# Patient Record
Sex: Female | Born: 1950
Health system: Southern US, Community
[De-identification: ages and names within clinical notes are randomized; demographics above are authoritative.]

## PROBLEM LIST (undated history)

## (undated) DIAGNOSIS — M1990 Unspecified osteoarthritis, unspecified site: Secondary | ICD-10-CM

## (undated) DIAGNOSIS — I10 Essential (primary) hypertension: Secondary | ICD-10-CM

---

## 1998-07-15 ENCOUNTER — Other Ambulatory Visit: Admission: RE | Admit: 1998-07-15 | Discharge: 1998-07-15 | Payer: Self-pay | Admitting: Obstetrics & Gynecology

## 2018-05-23 DIAGNOSIS — Z9889 Other specified postprocedural states: Secondary | ICD-10-CM

## 2018-05-23 HISTORY — DX: Other specified postprocedural states: Z98.890

## 2018-09-02 ENCOUNTER — Other Ambulatory Visit: Payer: Self-pay

## 2018-09-02 ENCOUNTER — Encounter (HOSPITAL_COMMUNITY): Payer: Self-pay | Admitting: Emergency Medicine

## 2018-09-02 ENCOUNTER — Emergency Department (HOSPITAL_COMMUNITY)
Admission: EM | Admit: 2018-09-02 | Discharge: 2018-09-02 | Disposition: A | Discharge status: Home / Self Care | Payer: Medicare Other | Attending: Emergency Medicine | Admitting: Emergency Medicine

## 2018-09-02 ENCOUNTER — Emergency Department (HOSPITAL_COMMUNITY): Payer: Medicare Other

## 2018-09-02 DIAGNOSIS — K625 Hemorrhage of anus and rectum: Secondary | ICD-10-CM | POA: Insufficient documentation

## 2018-09-02 DIAGNOSIS — R1084 Generalized abdominal pain: Secondary | ICD-10-CM | POA: Diagnosis not present

## 2018-09-02 LAB — POC OCCULT BLOOD, ED: Fecal Occult Bld: POSITIVE — AB

## 2018-09-02 LAB — CBC WITH DIFFERENTIAL/PLATELET
Abs Immature Granulocytes: 0.01 10*3/uL (ref 0.00–0.07)
Basophils Absolute: 0 10*3/uL (ref 0.0–0.1)
Basophils Relative: 0 %
Eosinophils Absolute: 0 10*3/uL (ref 0.0–0.5)
Eosinophils Relative: 0 %
HCT: 40.2 % (ref 36.0–46.0)
Hemoglobin: 12.6 g/dL (ref 12.0–15.0)
Immature Granulocytes: 0 %
Lymphocytes Relative: 16 %
Lymphs Abs: 1.2 10*3/uL (ref 0.7–4.0)
MCH: 28.8 pg (ref 26.0–34.0)
MCHC: 31.3 g/dL (ref 30.0–36.0)
MCV: 92 fL (ref 80.0–100.0)
Monocytes Absolute: 0.4 10*3/uL (ref 0.1–1.0)
Monocytes Relative: 5 %
Neutro Abs: 5.7 10*3/uL (ref 1.7–7.7)
Neutrophils Relative %: 79 %
Platelets: 236 10*3/uL (ref 150–400)
RBC: 4.37 MIL/uL (ref 3.87–5.11)
RDW: 13.9 % (ref 11.5–15.5)
WBC: 7.3 10*3/uL (ref 4.0–10.5)
nRBC: 0 % (ref 0.0–0.2)

## 2018-09-02 LAB — COMPREHENSIVE METABOLIC PANEL
ALT: 14 U/L (ref 0–44)
AST: 15 U/L (ref 15–41)
Albumin: 4.2 g/dL (ref 3.5–5.0)
Alkaline Phosphatase: 67 U/L (ref 38–126)
Anion gap: 11 (ref 5–15)
BUN: 21 mg/dL (ref 8–23)
CO2: 23 mmol/L (ref 22–32)
Calcium: 8.8 mg/dL — ABNORMAL LOW (ref 8.9–10.3)
Chloride: 104 mmol/L (ref 98–111)
Creatinine, Ser: 1.07 mg/dL — ABNORMAL HIGH (ref 0.44–1.00)
GFR calc Af Amer: >60 mL/min (ref >60)
GFR calc non Af Amer: 54 mL/min — ABNORMAL LOW (ref >60)
Glucose, Bld: 133 mg/dL — ABNORMAL HIGH (ref 70–99)
Potassium: 3.5 mmol/L (ref 3.5–5.1)
Sodium: 138 mmol/L (ref 135–145)
Total Bilirubin: 0.6 mg/dL (ref 0.3–1.2)
Total Protein: 7.8 g/dL (ref 6.5–8.1)

## 2018-09-02 LAB — ABO/RH: ABO/RH(D): A POS

## 2018-09-02 LAB — TYPE AND SCREEN
ABO/RH(D): A POS
Antibody Screen: NEGATIVE

## 2018-09-02 LAB — LIPASE, BLOOD: Lipase: 40 U/L (ref 11–51)

## 2018-09-02 LAB — HEMOGLOBIN AND HEMATOCRIT, BLOOD
HCT: 36.5 % (ref 36.0–46.0)
Hemoglobin: 11.9 g/dL — ABNORMAL LOW (ref 12.0–15.0)

## 2018-09-02 MED ORDER — SODIUM CHLORIDE (PF) 0.9 % IJ SOLN
INTRAMUSCULAR | Status: AC
  Filled 2018-09-02: qty 50

## 2018-09-02 MED ORDER — IOHEXOL 300 MG/ML  SOLN
100.0000 mL | Freq: Once | INTRAMUSCULAR | Status: AC | PRN
  Administered 2018-09-02: 100 mL via INTRAVENOUS

## 2018-09-02 MED ORDER — SODIUM CHLORIDE 0.9 % IV BOLUS
500.0000 mL | Freq: Once | INTRAVENOUS | Status: AC
  Administered 2018-09-02: 500 mL via INTRAVENOUS

## 2018-09-02 NOTE — ED Provider Notes (Signed)
[facility]-EMERGENCY DEPT Provider Note   CSN: [id number] Arrival date & time: 09/02/18  1349    History   Chief Complaint Chief Complaint  Patient presents with   GI Bleeding    HPI [place] is a 68 y.o. female presenting for evaluation of bright red blood per rectum and abdominal discomfort.  Patient states acutely at 1030 this morning, she had a bowel movement that she noticed was bloody.  She describes it as a mix of bright red and dark stool.  She has had 4-5 bowel movements since, all have been bloody.  She reports generalized abdominal discomfort, but states this may be due to anxiety.  She also reports some mild nausea, but once again attributes this to anxiety.  Patient states she does not see doctors, because she gets very anxious.  She has never had a colonoscopy.  She reports no medical problems, takes no medications daily.  She denies history of blood in the stool previously.  She denies significant hemorrhoid history.  She does not take NSAIDs frequently.  She denies significant heartburn.  She denies recent fevers, chills, chest pain, shortness of breath, vomiting, urinary symptoms.  She denies history of abdominal surgeries or abdominal problems.  She has had 2 cups of coffee today, but no p.o.  She had no symptoms yesterday.     HPI  History reviewed. No pertinent past medical history.  There are no active problems to display for this patient.   History reviewed. No pertinent surgical history.   OB History   No obstetric history on file.      Home Medications    Prior to Admission medications   Not on File    Family History No family history on file.  Social History Social History   Tobacco Use   Smoking status: Never Smoker   Smokeless tobacco: Never Used  Substance Use Topics   Alcohol use: Yes    Alcohol/week: 5.0 standard drinks    Types: 5 Glasses of wine per week   Drug use: Not on file     Allergies    Other   Review of Systems Review of Systems  Gastrointestinal: Positive for abdominal pain, blood in stool and diarrhea.  Psychiatric/Behavioral: The patient is nervous/anxious.   All other systems reviewed and are negative.    Physical Exam Updated Vital Signs BP (!) 171/96    Pulse 93    Temp 98.4 F (36.9 C) (Oral)    Resp (!) 33    Ht  (1.753 m)    SpO2 98%   Physical Exam Vitals signs and nursing note reviewed. Exam conducted with a chaperone present.  Constitutional:      General: She is not in acute distress.    Appearance: She is well-developed.     Comments: Obese female who appears anxious, but no acute distress  HENT:     Head: Normocephalic and atraumatic.  Eyes:     Extraocular Movements: Extraocular movements intact.     Conjunctiva/sclera: Conjunctivae normal.     Pupils: Pupils are equal, round, and reactive to light.  Neck:     Musculoskeletal: Normal range of motion and neck supple.  Cardiovascular:     Rate and Rhythm: Regular rhythm. Tachycardia present.     Pulses: Normal pulses.     Comments: Tachycardic around 110 Pulmonary:     Effort: Pulmonary effort is normal. No respiratory distress.     Breath sounds: Normal breath sounds. No  wheezing.     Comments: Clear lung sounds in all fields. Abdominal:     General: There is no distension.     Palpations: Abdomen is soft. There is no mass.     Tenderness: There is no abdominal tenderness. There is no guarding or rebound.     Comments: Patient reports generalized discomfort, but no focal tenderness to palpation.  No rigidity, guarding, distention.  Negative rebound.  Genitourinary:    Rectum: Guaiac result positive.     Comments: Bright red blood noted on rectal exam. Musculoskeletal: Normal range of motion.  Skin:    General: Skin is warm and dry.     Capillary Refill: Capillary refill takes less than 2 seconds.  Neurological:     Mental Status: She is alert and oriented to person, place,  and time.  Psychiatric:        Mood and Affect: Mood is anxious.     Comments: Patient is very anxious.  Initially, patient was shaking, however calm down while answering questions and throughout h&p      ED Treatments / Results  Labs (all labs ordered are listed, but only abnormal results are displayed) Labs Reviewed  COMPREHENSIVE METABOLIC PANEL - Abnormal; Notable for the following components:      Result Value   Glucose, Bld 133 (*)    Creatinine, Ser 1.07 (*)    Calcium 8.8 (*)    GFR calc non Af Amer 54 (*)    All other components within normal limits  HEMOGLOBIN AND HEMATOCRIT, BLOOD - Abnormal; Notable for the following components:   Hemoglobin 11.9 (*)    All other components within normal limits  POC OCCULT BLOOD, ED - Abnormal; Notable for the following components:   Fecal Occult Bld POSITIVE (*)    All other components within normal limits  CBC WITH DIFFERENTIAL/PLATELET  LIPASE, BLOOD  TYPE AND SCREEN  ABO/RH    EKG None  Radiology Ct Abdomen Pelvis W Contrast  Result Date: 09/02/2018 CLINICAL DATA:  68 year old with acute onset crampy abdominal pain and 4-5 episodes of bloody loose stools that began this morning. EXAM: CT ABDOMEN AND PELVIS WITH CONTRAST TECHNIQUE: Multidetector CT imaging of the abdomen and pelvis was performed using the standard protocol following bolus administration of intravenous contrast. CONTRAST:  OMNIPAQUE IOHEXOL 300 MG/ML IV. COMPARISON:  None. FINDINGS: Lower chest: Heart size upper normal. Visualized lung bases clear. Hepatobiliary: Liver normal in size and appearance. Gallbladder normal in appearance without calcified gallstones. No biliary ductal dilation. Pancreas: Normal in appearance without evidence of mass, ductal dilation, or inflammation. Spleen: Normal in size and appearance. Adrenals/Urinary Tract: Normal appearing adrenal glands. Kidneys normal in size and appearance without focal parenchymal abnormality. No  hydronephrosis. No evidence of urinary tract calculi. Urinary bladder decompressed and unremarkable. Stomach/Bowel: Small to moderate-sized hiatal hernia. Stomach otherwise normal in appearance. Normal-appearing small bowel. Diffuse colonic diverticulosis without evidence of acute diverticulitis. Mobile cecum positioned in the RIGHT UPPER QUADRANT of the abdomen. Normal appearing gas-filled appendix in the RIGHT UPPER QUADRANT with its tip adjacent to the inferior liver edge. Vascular/Lymphatic: Mild abdominal aortic atherosclerosis without evidence of aneurysm. Normal-appearing portal venous and systemic venous systems. No pathologic lymphadenopathy. Reproductive: Normal-appearing atrophic uterus. No adnexal masses. Other: Pelvic phleboliths. Small umbilical hernia containing fat. Musculoskeletal: Osseous demineralization. Facet degenerative changes throughout the lumbar spine. No acute findings. IMPRESSION: 1. No acute abnormalities involving the abdomen or pelvis. 2. Diffuse colonic diverticulosis without evidence of acute diverticulitis. 3. Small to  moderate-sized hiatal hernia. 4.  Aortic Atherosclerosis, mild.  (ICD10-170.0) Electronically Signed   By: Hulan Saashomas  Lawrence M.D.   On: 09/02/2018 16:28    Procedures Procedures (including critical care time)  Medications Ordered in ED Medications  sodium chloride (PF) 0.9 % injection (0 mLs  Hold 09/02/18 1625)  sodium chloride 0.9 % bolus 500 mL (0 mLs Intravenous Stopped 09/02/18 1653)  iohexol (OMNIPAQUE) 300 MG/ML solution 100 mL (100 mLs Intravenous Contrast Given 09/02/18 1557)     Initial Impression / Assessment and Plan / ED Course  I have reviewed the triage vital signs and the nursing notes.  Pertinent labs & imaging results that were available during my care of the patient were reviewed by me and considered in my medical decision making (see chart for details).        Patient presenting for evaluation of bright red blood per rectum.   Physical exam shows patient who is very anxious.  It is hard to distinguish if the tachycardia is due to her anxiety or blood loss.  Patient also reporting nausea and abdominal discomfort, which she is attributing to anxiety.  However, consider possible diverticulitis versus other intra-abdominal infection versus abnormality.  As such, will obtain labs, CT, and reassess.  Labs reassuring, initial hemoglobin stable at 12.6.  No leukocytosis.  Mild dehydration with a slightly elevated creatinine 1.07, although no baseline to compare.  CT pending.  CT negative for reticulitis or obvious cause for bleed.  Patient does have diverticulosis, consider possible bleed from there. Repeat hgb stable at 11.9.  As patient is not on blood thinners, has a stable hemoglobin, and appears well, I do not believe she needs to come into the ER for an emergent colonoscopy or EGD. However, I am concerned about her ability to follow-up.  As such, will consult with GI.  Discussed with Dr. Marca AnconaKarki from [facility] who states somebody from their office will call the patient to set up a follow-up appointment.  Discussed findings and plan with patient.  Discussed strict return precautions including fevers, pain, worsening bleeding, or signs of anemia.  At this time, patient appears safe for discharge.  Patient states she understands agrees plan.   Final Clinical Impressions(s) / ED Diagnoses   Final diagnoses:  BRBPR (bright red blood per rectum)    ED Discharge Orders    None       Alveria ApleyCaccavale, Savio Albrecht, PA-C 09/02/18 1911    Lorre NickAllen, Anthony, MD 09/03/18 1102

## 2018-09-02 NOTE — ED Provider Notes (Signed)
Medical screening examination/treatment/procedure(s) were conducted as a shared visit with non-physician practitioner(s) and myself.  I personally evaluated the patient during the encounter.  None 68 year old female presents with acute onset of rectal bleeding that began today.  Notes abdominal bloating but no distention.  No prior history of same.  Suspect diverticular source and labs and abdominal CT are pending at this time   Lorre Nick, MD 09/02/18 1450

## 2018-09-02 NOTE — ED Triage Notes (Signed)
Patient from home c/o mild cramping and multiple (4-5) urgent episodes of loose bloody stools. Pt denies nausea or vomiting. Pt very anxious, states she has fear and anxiety of doctors and hospitals.

## 2018-09-02 NOTE — Discharge Instructions (Addendum)
Somebody from the stomach doctor/GI doctors office should be calling you to set up an appointment.  If you do not hear from them, you may call their office listed below to set up follow-up. You should start with a clear liquid diet, progressing to soft foods and then a bland diet as symptoms improve. Avoid things that may irritate your GI tract such as ibuprofen, Aleve, naproxen, spicy food, acidic food, caffeine. Return to the emergency room if you develop significant worsening bleeding, fevers, swelling and pain of your stomach, persistent vomiting, or any new, worsening, or concerning symptoms.

## 2018-10-19 LAB — HM COLONOSCOPY

## 2019-12-16 ENCOUNTER — Encounter (HOSPITAL_COMMUNITY): Payer: Self-pay

## 2019-12-16 ENCOUNTER — Other Ambulatory Visit: Payer: Self-pay

## 2019-12-16 DIAGNOSIS — L03116 Cellulitis of left lower limb: Secondary | ICD-10-CM | POA: Insufficient documentation

## 2019-12-16 DIAGNOSIS — R000 Tachycardia, unspecified: Secondary | ICD-10-CM | POA: Diagnosis not present

## 2019-12-16 LAB — COMPREHENSIVE METABOLIC PANEL
ALT: 32 U/L (ref 0–44)
AST: 33 U/L (ref 15–41)
Albumin: 3.9 g/dL (ref 3.5–5.0)
Alkaline Phosphatase: 106 U/L (ref 38–126)
Anion gap: 11 (ref 5–15)
BUN: 14 mg/dL (ref 8–23)
CO2: 25 mmol/L (ref 22–32)
Calcium: 9 mg/dL (ref 8.9–10.3)
Chloride: 100 mmol/L (ref 98–111)
Creatinine, Ser: 0.95 mg/dL (ref 0.44–1.00)
GFR calc Af Amer: >60 mL/min (ref >60)
GFR calc non Af Amer: >60 mL/min (ref >60)
Glucose, Bld: 116 mg/dL — ABNORMAL HIGH (ref 70–99)
Potassium: 3.3 mmol/L — ABNORMAL LOW (ref 3.5–5.1)
Sodium: 136 mmol/L (ref 135–145)
Total Bilirubin: 1 mg/dL (ref 0.3–1.2)
Total Protein: 8.2 g/dL — ABNORMAL HIGH (ref 6.5–8.1)

## 2019-12-16 LAB — CBC WITH DIFFERENTIAL/PLATELET
Abs Immature Granulocytes: 0.03 10*3/uL (ref 0.00–0.07)
Basophils Absolute: 0 10*3/uL (ref 0.0–0.1)
Basophils Relative: 0 %
Eosinophils Absolute: 0 10*3/uL (ref 0.0–0.5)
Eosinophils Relative: 0 %
HCT: 43.4 % (ref 36.0–46.0)
Hemoglobin: 14 g/dL (ref 12.0–15.0)
Immature Granulocytes: 0 %
Lymphocytes Relative: 13 %
Lymphs Abs: 0.9 10*3/uL (ref 0.7–4.0)
MCH: 29.2 pg (ref 26.0–34.0)
MCHC: 32.3 g/dL (ref 30.0–36.0)
MCV: 90.6 fL (ref 80.0–100.0)
Monocytes Absolute: 0.6 10*3/uL (ref 0.1–1.0)
Monocytes Relative: 9 %
Neutro Abs: 5.3 10*3/uL (ref 1.7–7.7)
Neutrophils Relative %: 78 %
Platelets: 194 10*3/uL (ref 150–400)
RBC: 4.79 MIL/uL (ref 3.87–5.11)
RDW: 14.5 % (ref 11.5–15.5)
WBC: 6.8 10*3/uL (ref 4.0–10.5)
nRBC: 0 % (ref 0.0–0.2)

## 2019-12-16 NOTE — ED Triage Notes (Signed)
Pt reports bilateral LE swelling, and redness getting worse since yesterday.

## 2019-12-17 ENCOUNTER — Emergency Department (HOSPITAL_COMMUNITY)
Admission: EM | Admit: 2019-12-17 | Discharge: 2019-12-17 | Disposition: A | Discharge status: Home / Self Care | Payer: Medicare Other | Attending: Emergency Medicine | Admitting: Emergency Medicine

## 2019-12-17 DIAGNOSIS — L03116 Cellulitis of left lower limb: Secondary | ICD-10-CM | POA: Diagnosis not present

## 2019-12-17 MED ORDER — SODIUM CHLORIDE 0.9 % IV SOLN
3.0000 g | Freq: Once | INTRAVENOUS | Status: AC
  Administered 2019-12-17: 3 g via INTRAVENOUS
  Filled 2019-12-17: qty 8

## 2019-12-17 MED ORDER — SULFAMETHOXAZOLE-TRIMETHOPRIM 800-160 MG PO TABS: 1.0000 | ORAL_TABLET | Freq: Two times a day (BID) | ORAL | 0 refills | Status: DC

## 2019-12-17 MED ORDER — VANCOMYCIN HCL IN DEXTROSE 1-5 GM/200ML-% IV SOLN
1000.0000 mg | Freq: Once | INTRAVENOUS | Status: AC
  Administered 2019-12-17: 1000 mg via INTRAVENOUS
  Filled 2019-12-17: qty 200

## 2019-12-17 MED ORDER — AMOXICILLIN-POT CLAVULANATE 875-125 MG PO TABS: 1.0000 | ORAL_TABLET | Freq: Two times a day (BID) | ORAL | 0 refills | Status: DC

## 2019-12-17 NOTE — ED Provider Notes (Signed)
[facility]-EMERGENCY DEPT Provider Note   CSN: [id number] Arrival date & time: 12/16/19  2011   Time seen 5:25 AM  History Chief Complaint  Patient presents with  . Cellulitis    Tammy Fitzpatrick is a 69 y.o. female.  HPI   Patient states last week her dog jumped up on the bed and its toenail made a puncture wound on the anterior part of her left lower leg.  She states it bled briefly and was fine.  She states July 23 she had shakes and chills all day without fever.  She slept all day on July 24.  On July 25 she had a small red area close to where the puncture wound was and it was about 10 cm in size.  She states yesterday, July 26 while she was at work she noted her left leg was feeling tight and she started having increasing redness.  She states when she came to the ED the redness extended between her ankle and below her knee however while she has been waiting to be seen which was 10 hours the redness has spread to the anterior portion of her knee.  Patient states she took [employer] vaccine.  PCP None  History reviewed. No pertinent past medical history.  There are no problems to display for this patient.   History reviewed. No pertinent surgical history.   OB History   No obstetric history on file.     No family history on file.  Social History   Tobacco Use  . Smoking status: Never Smoker  . Smokeless tobacco: Never Used  Vaping Use  . Vaping Use: Never used  Substance Use Topics  . Alcohol use: Yes    Alcohol/week: 5.0 standard drinks    Types: 5 Glasses of wine per week  . Drug use: Not on file    Home Medications Prior to Admission medications   Medication Sig Start Date End Date Taking? Authorizing Provider  amoxicillin-clavulanate (AUGMENTIN) 875-125 MG tablet Take 1 tablet by mouth every 12 (twelve) hours. 12/17/19   Devoria Albe, MD  sulfamethoxazole-trimethoprim (BACTRIM DS) 800-160 MG tablet Take 1 tablet by mouth 2 (two) times  daily. 12/17/19   Devoria Albe, MD    Allergies    Other  Review of Systems   Review of Systems  All other systems reviewed and are negative.   Physical Exam Updated Vital Signs BP (!) 198/98   Pulse 94   Temp 99.4 F (37.4 C) (Oral)   Resp 16   Ht 5\' 9"  (1.753 m)   Wt (!) 113.4 kg   SpO2 96%   BMI 36.92 kg/m   Physical Exam Vitals and nursing note reviewed.  Constitutional:      General: She is not in acute distress.    Appearance: Normal appearance. She is obese.  HENT:     Head: Normocephalic and atraumatic.     Right Ear: External ear normal.     Left Ear: External ear normal.  Eyes:     Extraocular Movements: Extraocular movements intact.     Conjunctiva/sclera: Conjunctivae normal.  Cardiovascular:     Rate and Rhythm: Tachycardia present.  Pulmonary:     Effort: Pulmonary effort is normal. No respiratory distress.  Musculoskeletal:        General: Swelling present.  Skin:    General: Skin is warm and dry.     Findings: Erythema present.     Comments: Patient is noted to have diffuse mild  swelling of her left lower leg with intense bright redness of the skin between her ankle and to the anterior portion of her knee.  It is 6 also almost circumferential around the ankle area.  There also is some mild redness and swelling involving the foot.  She has good distal pulses and capillary refill.  Neurological:     General: No focal deficit present.     Mental Status: She is alert and oriented to person, place, and time.     Cranial Nerves: No cranial nerve deficit.  Psychiatric:        Mood and Affect: Mood normal.        Behavior: Behavior normal.        Thought Content: Thought content normal.       ED Results / Procedures / Treatments   Labs (all labs ordered are listed, but only abnormal results are displayed) Results for orders placed or performed during the hospital encounter of 12/17/19  CBC with Differential  Result Value Ref Range   WBC 6.8 4.0 -  10.5 K/uL   RBC 4.79 3.87 - 5.11 MIL/uL   Hemoglobin 14.0 12.0 - 15.0 g/dL   HCT 41.6 36 - 46 %   MCV 90.6 80.0 - 100.0 fL   MCH 29.2 26.0 - 34.0 pg   MCHC 32.3 30.0 - 36.0 g/dL   RDW 60.6 30.1 - 60.1 %   Platelets 194 150 - 400 K/uL   nRBC 0.0 0.0 - 0.2 %   Neutrophils Relative % 78 %   Neutro Abs 5.3 1.7 - 7.7 K/uL   Lymphocytes Relative 13 %   Lymphs Abs 0.9 0.7 - 4.0 K/uL   Monocytes Relative 9 %   Monocytes Absolute 0.6 0 - 1 K/uL   Eosinophils Relative 0 %   Eosinophils Absolute 0.0 0 - 0 K/uL   Basophils Relative 0 %   Basophils Absolute 0.0 0 - 0 K/uL   Immature Granulocytes 0 %   Abs Immature Granulocytes 0.03 0.00 - 0.07 K/uL  Comprehensive metabolic panel  Result Value Ref Range   Sodium 136 135 - 145 mmol/L   Potassium 3.3 (L) 3.5 - 5.1 mmol/L   Chloride 100 98 - 111 mmol/L   CO2 25 22 - 32 mmol/L   Glucose, Bld 116 (H) 70 - 99 mg/dL   BUN 14 8 - 23 mg/dL   Creatinine, Ser 0.93 0.44 - 1.00 mg/dL   Calcium 9.0 8.9 - 23.5 mg/dL   Total Protein 8.2 (H) 6.5 - 8.1 g/dL   Albumin 3.9 3.5 - 5.0 g/dL   AST 33 15 - 41 U/L   ALT 32 0 - 44 U/L   Alkaline Phosphatase 106 38 - 126 U/L   Total Bilirubin 1.0 0.3 - 1.2 mg/dL   GFR calc non Af Amer >60 >60 mL/min   GFR calc Af Amer >60 >60 mL/min   Anion gap 11 5 - 15   Laboratory interpretation all normal except mild hypokalemia, nonfasting hyperglycemia    EKG None  Radiology No results found.  Procedures Procedures (including critical care time)  Medications Ordered in ED Medications  vancomycin (VANCOCIN) IVPB 1000 mg/200 mL premix (1,000 mg Intravenous New Bag/Given 12/17/19 0656)  Ampicillin-Sulbactam (UNASYN) 3 g in sodium chloride 0.9 % 100 mL IVPB (0 g Intravenous Stopped 12/17/19 5732)    ED Course  I have reviewed the triage vital signs and the nursing notes.  Pertinent labs & imaging results that were available during  my care of the patient were reviewed by me and considered in my medical decision  making (see chart for details).    MDM Rules/Calculators/A&P                          Patient was given a dose of IV vancomycin and in consideration that this started out as a puncture wound from a dog toenail which could have dog saliva on it she also was given Unasyn.  Patient does not want to be admitted to the hospital.  She wants to try outpatient therapy.  She does want however want to do the first dose of antibiotics IV.  When she is discharged home I will discharge her home on Augmentin and Septra for same reasoning as the IV antibiotics.  7:00 AM patient is gotten her Unasyn IV and is just starting her vancomycin.  She will be discharged home per her request to treat her cellulitis.  Final Clinical Impression(s) / ED Diagnoses Final diagnoses:  Left leg cellulitis    Rx / DC Orders ED Discharge Orders         Ordered    amoxicillin-clavulanate (AUGMENTIN) 875-125 MG tablet  Every 12 hours     Discontinue  Reprint     12/17/19 0643    sulfamethoxazole-trimethoprim (BACTRIM DS) 800-160 MG tablet  2 times daily     Discontinue  Reprint     12/17/19 0643          Plan discharge  Devoria Albe, MD, Concha Pyo, MD 12/17/19 [phone]

## 2019-12-17 NOTE — Discharge Instructions (Signed)
Elevate your leg.  Take the 2 antibiotics until gone.  Return to the emergency room if you get fever, vomiting, worsening pain or if the redness continues to spread up your leg.

## 2020-11-16 ENCOUNTER — Emergency Department (HOSPITAL_COMMUNITY)
Admission: EM | Admit: 2020-11-16 | Discharge: 2020-11-16 | Disposition: A | Discharge status: Home / Self Care | Payer: Medicare Other | Attending: Emergency Medicine | Admitting: Emergency Medicine

## 2020-11-16 ENCOUNTER — Encounter (HOSPITAL_COMMUNITY): Payer: Self-pay

## 2020-11-16 ENCOUNTER — Other Ambulatory Visit: Payer: Self-pay

## 2020-11-16 DIAGNOSIS — S8991XA Unspecified injury of right lower leg, initial encounter: Secondary | ICD-10-CM | POA: Diagnosis present

## 2020-11-16 DIAGNOSIS — I10 Essential (primary) hypertension: Secondary | ICD-10-CM | POA: Insufficient documentation

## 2020-11-16 DIAGNOSIS — Z23 Encounter for immunization: Secondary | ICD-10-CM | POA: Diagnosis not present

## 2020-11-16 DIAGNOSIS — S81811A Laceration without foreign body, right lower leg, initial encounter: Secondary | ICD-10-CM | POA: Insufficient documentation

## 2020-11-16 DIAGNOSIS — W548XXA Other contact with dog, initial encounter: Secondary | ICD-10-CM | POA: Insufficient documentation

## 2020-11-16 HISTORY — DX: Unspecified osteoarthritis, unspecified site: M19.90

## 2020-11-16 HISTORY — DX: Essential (primary) hypertension: I10

## 2020-11-16 MED ORDER — LIDOCAINE-EPINEPHRINE 2 %-1:100000 IJ SOLN: 20.0000 mL | Freq: Once | INTRAMUSCULAR | Status: DC

## 2020-11-16 MED ORDER — CEPHALEXIN 500 MG PO CAPS
500.0000 mg | ORAL_CAPSULE | Freq: Once | ORAL | Status: AC
  Administered 2020-11-16: 500 mg via ORAL
  Filled 2020-11-16: qty 1

## 2020-11-16 MED ORDER — LIDOCAINE-EPINEPHRINE (PF) 2 %-1:200000 IJ SOLN
20.0000 mL | Freq: Once | INTRAMUSCULAR | Status: AC
  Administered 2020-11-16: 20 mL via INTRADERMAL

## 2020-11-16 MED ORDER — CEPHALEXIN 500 MG PO CAPS: 500.0000 mg | ORAL_CAPSULE | Freq: Two times a day (BID) | ORAL | 0 refills | Status: AC

## 2020-11-16 MED ORDER — TETANUS-DIPHTH-ACELL PERTUSSIS 5-2.5-18.5 LF-MCG/0.5 IM SUSY
0.5000 mL | PREFILLED_SYRINGE | Freq: Once | INTRAMUSCULAR | Status: AC
  Administered 2020-11-16: 0.5 mL via INTRAMUSCULAR
  Filled 2020-11-16: qty 0.5

## 2020-11-16 NOTE — ED Notes (Signed)
Provided pt w 4x4 gauze and kerlex to bring home w her, went over dc paperwork, follow up care, wound care- advised pt that sutures need to be removed in 10 days- can go to wound care, UC, PCP or ED. Pt understood and has no questions

## 2020-11-16 NOTE — Discharge Instructions (Addendum)
Your stitches need to be removed in 10 days.  You should schedule a follow-up appointment of possible in the office with the wound care specialist at the number above.  If you are not able to make this appointment, you can return to an urgent care, primary care doctor's office, or emergency department for the stitches to be removed.  Please keep the bandages covering your wound for at least 2 days.  Keep it dry for that period.  Afterwards, you can take down the bandages to begin to shower normally.  If you choose to, you can then wrap the wound with a bandage to prevent any bleeding onto your clothes. Try not to soak the wound under water.  Do not put peroxide or bacitracin or other ointment on the wound.  I prescribed you antibiotics to prevent an infection.    Your wound is under a lot of pressure.  Try to keep off your feet as much as possible for the next several days, avoid strenuous activity.  When you are at home put your leg up on the couch.  If you begin having sudden bleeding, take a clean piece of gauze and hold very firm pressure directly over the bleed with a single finger.  Do not let go for 10 minutes.  If you have persistent bleeding after that, or if you are feeling lightheaded, or concerned about blood loss, call 911 or come back to the ER.  If you notice redness creeping up or down your leg, or begin having fevers, or you see pus coming from the wound, please return to the emergency department.  These are signs of an infection that are not being adequately treated with your antibiotic at home.  *  We gave you a tetanus booster while you were here in the ER.

## 2020-11-16 NOTE — ED Triage Notes (Signed)
Pt c/o rt lower leg lac, states dogs jumped on her while she was sitting in bed, and caused a large laceration. Pt denies anticoags, states dogs are up to date on vaccines. C/o 3/10 pain

## 2020-11-16 NOTE — ED Provider Notes (Signed)
[facility]-EMERGENCY DEPT Provider Note   CSN: [id number] Arrival date & time: 11/16/20  2952     History CC:  Laceration to right leg   Tammy Fitzpatrick is a 70 y.o. female presenting to ED with right leg laceration. She reports her dogs jumped on the bed today and accidentally scratched her right leg, which began bleeding.  She arrives by POV.  Unsure of last tetanus shot.  No dog bite today.  Not on A/C  HPI     Past Medical History:  Diagnosis Date   Arthritis    Hypertension     There are no problems to display for this patient.   History reviewed. No pertinent surgical history.   OB History   No obstetric history on file.     No family history on file.  Social History   Tobacco Use   Smoking status: Never   Smokeless tobacco: Never  Vaping Use   Vaping Use: Never used  Substance Use Topics   Alcohol use: Yes    Alcohol/week: 2.0 standard drinks    Types: 2 Glasses of wine per week   Drug use: Never    Home Medications Prior to Admission medications   Medication Sig Start Date End Date Taking? Authorizing Provider  cephALEXin (KEFLEX) 500 MG capsule Take 1 capsule (500 mg total) by mouth 2 (two) times daily for 5 days. 11/16/20 11/21/20 Yes Kaiyana Bedore, Kermit Balo, MD  Multiple Vitamin (MULTIVITAMIN WITH MINERALS) TABS tablet Take 1 tablet by mouth daily.   Yes [provider]  Pseudoephedrine-Ibuprofen (ADVIL COLD/SINUS PO) Take 1 tablet by mouth daily as needed (headache).   Yes [provider]  amoxicillin-clavulanate (AUGMENTIN) 875-125 MG tablet Take 1 tablet by mouth every 12 (twelve) hours. Patient not taking: Reported on 11/16/2020 12/17/19   Devoria Albe, MD  sulfamethoxazole-trimethoprim (BACTRIM DS) 800-160 MG tablet Take 1 tablet by mouth 2 (two) times daily. Patient not taking: Reported on 11/16/2020 12/17/19   Devoria Albe, MD    Allergies    Other  Review of Systems   Review of Systems  Constitutional:   Negative for chills and fever.  Gastrointestinal:  Negative for abdominal pain and vomiting.  Musculoskeletal:  Positive for myalgias. Negative for arthralgias and back pain.  Skin:  Positive for rash and wound.  Neurological:  Negative for weakness and numbness.   Physical Exam Updated Vital Signs BP (!) 181/90 (BP Location: Right Arm)   Pulse 84   Temp 97.6 F (36.4 C) (Oral)   Resp 18   Ht 5\' 9"  (1.753 m)   Wt 124.7 kg   SpO2 98%   BMI 40.61 kg/m   Physical Exam Constitutional:      General: She is not in acute distress. HENT:     Head: Normocephalic and atraumatic.  Eyes:     Conjunctiva/sclera: Conjunctivae normal.     Pupils: Pupils are equal, round, and reactive to light.  Cardiovascular:     Rate and Rhythm: Normal rate and regular rhythm.  Pulmonary:     Effort: Pulmonary effort is normal. No respiratory distress.  Musculoskeletal:     Comments: Chronic lymphedema of bilateral lower extremities Large dog ear laceration to right leg, involving skin only, subcutaneous fat exposed, small bleeding venule on medial corner aspect of wound. Laceration measuring approx 7 cm in total  Skin:    General: Skin is warm and dry.  Neurological:     Mental Status: She is alert.  ED Results / Procedures / Treatments   Labs (all labs ordered are listed, but only abnormal results are displayed) Labs Reviewed - No data to display  EKG None  Radiology No results found.  Procedures .Marland KitchenLaceration Repair  Date/Time: 11/16/2020 10:12 AM Performed by: Terald Sleeper, MD Authorized by: Terald Sleeper, MD   Consent:    Consent obtained:  Verbal   Consent given by:  Patient   Risks, benefits, and alternatives were discussed: yes     Risks discussed:  Pain, infection, poor cosmetic result, poor wound healing and need for additional repair Universal protocol:    Procedure explained and questions answered to patient or proxy's satisfaction: yes     Relevant documents  present and verified: yes     Test results available: yes     Imaging studies available: yes     Required blood products, implants, devices, and special equipment available: yes     Site/side marked: yes     Patient identity confirmed:  Arm band Anesthesia:    Anesthesia method:  Local infiltration   Local anesthetic:  Lidocaine 1% WITH epi Laceration details:    Location:  Leg   Leg location:  R lower leg   Length (cm):  7   Depth (mm):  2 Pre-procedure details:    Preparation:  Patient was prepped and draped in usual sterile fashion Exploration:    Hemostasis achieved with:  Epinephrine and direct pressure   Imaging outcome: foreign body not noted     Wound exploration: wound explored through full range of motion     Contaminated: no   Treatment:    Area cleansed with:  Saline   Amount of cleaning:  Standard   Irrigation solution:  Sterile saline   Debridement:  None Skin repair:    Repair method:  Staples and sutures   Suture size:  3-0   Suture material:  Prolene   Suture technique:  Figure eight and simple interrupted   Number of sutures:  8 Approximation:    Approximation:  Loose Repair type:    Repair type:  Complex Post-procedure details:    Dressing:  Non-adherent dressing   Procedure completion:  Tolerated well, no immediate complications Comments:     2 figure of 8 sutures placed to stem minimally bleeding venule on both ends of the wound Simple inturrupted sutures, both 3-0 and 4-0 prolene used to approximate the rest of the wound edges    Medications Ordered in ED Medications  lidocaine-EPINEPHrine (XYLOCAINE W/EPI) 2 %-1:200000 (PF) injection 20 mL (20 mLs Intradermal Given 11/16/20 0945)  cephALEXin (KEFLEX) capsule 500 mg (500 mg Oral Given 11/16/20 1016)  Tdap (BOOSTRIX) injection 0.5 mL (0.5 mLs Intramuscular Given 11/16/20 1032)    ED Course  I have reviewed the triage vital signs and the nursing notes.  Pertinent labs & imaging results that were  available during my care of the patient were reviewed by me and considered in my medical decision making (see chart for details).  70 yo female here with leg laceration Will update tdap Start on keflex given dirty nature of wound (dog nails)  Wound irrigated copiously prior to repair.  Complex wound repair requiring figure of 8 sutures to stem bleeding venules.  Bleeding stopped with pressure and sutures Doubt arterial bleed  Wound edges approximated on repair with 8 stitches I'm concerned about the healing of this wound given her lymphedema and likely poor vascular supply to this region.  The wound is  also under significant tension.  I advised she f/u in the plastic/wound care clinic for suture removal and wound reassessment in 10 days.    Final Clinical Impression(s) / ED Diagnoses Final diagnoses:  Laceration of right lower extremity, initial encounter    Rx / DC Orders ED Discharge Orders          Ordered    cephALEXin (KEFLEX) 500 MG capsule  2 times daily        11/16/20 1019             Meyah Corle, Kermit Balo, MD 11/16/20 1844

## 2020-11-16 NOTE — ED Notes (Signed)
Lido w epi administered by provider, suture trey brought to bedside, provider irrigated wound w sterile water

## 2020-11-26 ENCOUNTER — Encounter (HOSPITAL_COMMUNITY): Payer: Self-pay

## 2020-11-26 ENCOUNTER — Other Ambulatory Visit: Payer: Self-pay

## 2020-11-26 ENCOUNTER — Ambulatory Visit (HOSPITAL_COMMUNITY)
Admission: RE | Admit: 2020-11-26 | Discharge: 2020-11-26 | Disposition: A | Discharge status: Home / Self Care | Payer: Medicare Other | Source: Ambulatory Visit | Attending: Emergency Medicine | Admitting: Emergency Medicine

## 2020-11-26 DIAGNOSIS — L03115 Cellulitis of right lower limb: Secondary | ICD-10-CM | POA: Diagnosis not present

## 2020-11-26 MED ORDER — CEPHALEXIN 500 MG PO CAPS: 1000.0000 mg | ORAL_CAPSULE | Freq: Two times a day (BID) | ORAL | 0 refills | Status: DC

## 2020-11-26 NOTE — ED Triage Notes (Signed)
Pt came in to her suture removed. Pt wound is still red and inflamed. Pt states no fever or pain in the wound but she is concerned because of the discoloration.

## 2020-11-26 NOTE — ED Provider Notes (Signed)
[facility]    CSN: [id number] Arrival date & time: 11/26/20  1247      History   Chief Complaint Chief Complaint  Patient presents with   Suture / Staple Removal   Wound Check    HPI Tammy Fitzpatrick is a 70 y.o. female.   Patient presents for suture removal after dogs nail dug into skin. Requesting evaluation from provider due to redness on right lower leg. Denies fever, chills, warmth, discharge, pain, decreased ROM.   Past Medical History:  Diagnosis Date   Arthritis    Hypertension     There are no problems to display for this patient.   History reviewed. No pertinent surgical history.  OB History   No obstetric history on file.      Home Medications    Prior to Admission medications   Medication Sig Start Date End Date Taking? Authorizing Provider  cephALEXin (KEFLEX) 500 MG capsule Take 2 capsules (1,000 mg total) by mouth 2 (two) times daily. 11/26/20  Yes Artavis Cowie, Elita Boone, NP  amoxicillin-clavulanate (AUGMENTIN) 875-125 MG tablet Take 1 tablet by mouth every 12 (twelve) hours. Patient not taking: Reported on 11/16/2020 12/17/19   Devoria Albe, MD  Multiple Vitamin (MULTIVITAMIN WITH MINERALS) TABS tablet Take 1 tablet by mouth daily.    [provider]  Pseudoephedrine-Ibuprofen (ADVIL COLD/SINUS PO) Take 1 tablet by mouth daily as needed (headache).    [provider]  sulfamethoxazole-trimethoprim (BACTRIM DS) 800-160 MG tablet Take 1 tablet by mouth 2 (two) times daily. Patient not taking: Reported on 11/16/2020 12/17/19   Devoria Albe, MD    Family History History reviewed. No pertinent family history.  Social History Social History   Tobacco Use   Smoking status: Never   Smokeless tobacco: Never  Vaping Use   Vaping Use: Never used  Substance Use Topics   Alcohol use: Yes    Alcohol/week: 2.0 standard drinks    Types: 2 Glasses of wine per week   Drug use: Never     Allergies   Other   Review of  Systems Review of Systems  Constitutional: Negative.   Respiratory: Negative.    Skin:  Positive for wound. Negative for color change, pallor and rash.  Neurological: Negative.     Physical Exam Triage Vital Signs ED Triage Vitals [11/26/20 1342]  Enc Vitals Group     BP      Pulse      Resp      Temp      Temp src      SpO2      Weight      Height      Head Circumference      Peak Flow      Pain Score 0     Pain Loc      Pain Edu?      Excl. in GC?    No data found.  Updated Vital Signs There were no vitals taken for this visit.  Visual Acuity Right Eye Distance:   Left Eye Distance:   Bilateral Distance:    Right Eye Near:   Left Eye Near:    Bilateral Near:     Physical Exam Constitutional:      Appearance: Normal appearance. She is obese.  HENT:     Head: Normocephalic.  Eyes:     Extraocular Movements: Extraocular movements intact.  Pulmonary:     Effort: Pulmonary effort is normal.  Skin:    Comments: Suture  site appears intact, no signs of discharge, swelling, warmth, healing appropriately , diffuse redness is present over medial and anterior of lower right extremity   Neurological:     Mental Status: She is alert and oriented to person, place, and time. Mental status is at baseline.  Psychiatric:        Mood and Affect: Mood normal.        Behavior: Behavior normal.     UC Treatments / Results  Labs (all labs ordered are listed, but only abnormal results are displayed) Labs Reviewed - No data to display  EKG   Radiology No results found.  Procedures Procedures (including critical care time)  Medications Ordered in UC Medications - No data to display  Initial Impression / Assessment and Plan / UC Course  I have reviewed the triage vital signs and the nursing notes.  Pertinent labs & imaging results that were available during my care of the patient were reviewed by me and considered in my medical decision making (see chart for  details).  Non purulent cellulitis of right lower extremity   Leg appears to be in the beginning stages of cellulitis, site of wound does not appear to be infected but most likely has exposed patient to offending agent. Has history of cellulitis as well, will prescribed antibiotics, given strict return precautions for worsening infection, ok 'd for nurse to remove sutures today   Keflex 1000 mg bid for 5 days Final Clinical Impressions(s) / UC Diagnoses    Final diagnoses:  None   Discharge Instructions   None    ED Prescriptions     Medication Sig Dispense Auth. Provider   cephALEXin (KEFLEX) 500 MG capsule Take 2 capsules (1,000 mg total) by mouth 2 (two) times daily. 20 capsule Valinda Hoar, NP      PDMP not reviewed this encounter.   Valinda Hoar, NP 11/26/20 1356

## 2020-12-07 ENCOUNTER — Encounter (HOSPITAL_COMMUNITY): Payer: Self-pay

## 2020-12-07 ENCOUNTER — Ambulatory Visit (HOSPITAL_COMMUNITY)
Admission: RE | Admit: 2020-12-07 | Discharge: 2020-12-07 | Disposition: A | Discharge status: Home / Self Care | Payer: Medicare Other | Source: Ambulatory Visit | Attending: Family Medicine | Admitting: Family Medicine

## 2020-12-07 ENCOUNTER — Other Ambulatory Visit: Payer: Self-pay

## 2020-12-07 VITALS — BP 191/89 | HR 98 | Temp 98.6°F | Resp 18

## 2020-12-07 DIAGNOSIS — B9689 Other specified bacterial agents as the cause of diseases classified elsewhere: Secondary | ICD-10-CM

## 2020-12-07 DIAGNOSIS — L089 Local infection of the skin and subcutaneous tissue, unspecified: Secondary | ICD-10-CM | POA: Diagnosis not present

## 2020-12-07 DIAGNOSIS — S81801A Unspecified open wound, right lower leg, initial encounter: Secondary | ICD-10-CM

## 2020-12-07 MED ORDER — DOXYCYCLINE HYCLATE 100 MG PO CAPS: 100.0000 mg | ORAL_CAPSULE | Freq: Two times a day (BID) | ORAL | 0 refills | Status: DC

## 2020-12-07 NOTE — ED Triage Notes (Signed)
Pt presents for  wound check lower right leg. Reports she is worry, as the wound is not getting better. Reports having redness and bloody drainage when walking and when sleeping. Denies fever, chills, pain.

## 2020-12-07 NOTE — Progress Notes (Signed)
Orthopedic Tech Progress Note Patient Details:  [place] 08/08/1950 [id number]  Ortho Devices Type of Ortho Device: Radio broadcast assistant Ortho Device/Splint Location: Right Leg Ortho Device/Splint Interventions: Application   Post Interventions Patient Tolerated: Well  Tammy Fitzpatrick 12/07/2020, 10:55 AM

## 2020-12-07 NOTE — ED Provider Notes (Signed)
[facility]   [id number] 12/07/20 Arrival Time: 0947  ASSESSMENT & PLAN:  1. Localized bacterial infection of skin   2. Open wound of right lower leg, initial encounter    Unna boot applied by orthopaedic tech. WBAT.  Begin: Meds ordered this encounter  Medications   doxycycline (VIBRAMYCIN) 100 MG capsule    Sig: Take 1 capsule (100 mg total) by mouth 2 (two) times daily.    Dispense:  20 capsule    Refill:  0   Recommend:  Follow-up Information     [facility], [place]. Schedule an appointment as soon as possible for a visit .   Specialty: General Surgery Contact information: [address] [place] [address] [phone]         [facility]             . Call .   Contact information: [address] [place] [phone]               Reviewed expectations re: course of current medical issues. Questions answered. Outlined signs and symptoms indicating need for more acute intervention. Patient verbalized understanding. After Visit Summary given.   SUBJECTIVE:  Tammy Fitzpatrick is a 70 y.o. female who presents with a skin complaint. Open wound of inner RLE. From dog claw. Has been on what sounds like Keflex. "Think it got a little better but red now". Some heat/warmth. Able to bear weight. LE edema below wound stable. Afebrile. Occasional bloody drainage. "Just don't think it's healing". No extremity sensation changes or weakness.     OBJECTIVE: Vitals:   12/07/20 1013  BP: (!) 191/89  Pulse: 98  Resp: 18  Temp: 98.6 F (37 C)  TempSrc: Oral  SpO2: 100%    General appearance: alert; no distress HEENT: [place]; AT Extremities: moves all extremities normally Skin: warm and dry; inner R lower leg just above ankle with irreg shaped open wound with visible subcutaneous tissue visible; measures approx 2 x 4 inches; eschar if forming; no active bleeding or  drainage; surrounding warmth and erythema; 2+ edema of ankle below wound; no signs of gangrene Psychological: alert and cooperative; normal mood and affect  Allergies  Allergen Reactions   Other Hives and Rash    Poison Ivy    Past Medical History:  Diagnosis Date   Arthritis    Hypertension    Social History   Socioeconomic History   Marital status: Legally Separated    Spouse name: Not on file   Number of children: Not on file   Years of education: Not on file   Highest education level: Not on file  Occupational History   Not on file  Tobacco Use   Smoking status: Never   Smokeless tobacco: Never  Vaping Use   Vaping Use: Never used  Substance and Sexual Activity   Alcohol use: Yes    Alcohol/week: 2.0 standard drinks    Types: 2 Glasses of wine per week   Drug use: Never   Sexual activity: Not on file  Other Topics Concern   Not on file  Social History Narrative   Not on file   Social Determinants of Health   Financial Resource Strain: Not on file  Food Insecurity: Not on file  Transportation Needs: Not on file  Physical Activity: Not on file  Stress: Not on file  Social Connections: Not on file  Intimate Partner Violence: Not on  file   History reviewed. No pertinent family history. History reviewed. No pertinent surgical history.    Mardella Layman, MD 12/07/20 [phone]

## 2020-12-28 ENCOUNTER — Other Ambulatory Visit: Payer: Self-pay

## 2020-12-28 ENCOUNTER — Encounter (HOSPITAL_BASED_OUTPATIENT_CLINIC_OR_DEPARTMENT_OTHER): Payer: Medicare Other | Attending: Internal Medicine | Admitting: Physician Assistant

## 2020-12-28 DIAGNOSIS — W548XXD Other contact with dog, subsequent encounter: Secondary | ICD-10-CM | POA: Diagnosis not present

## 2020-12-28 DIAGNOSIS — I872 Venous insufficiency (chronic) (peripheral): Secondary | ICD-10-CM | POA: Insufficient documentation

## 2020-12-28 DIAGNOSIS — I890 Lymphedema, not elsewhere classified: Secondary | ICD-10-CM | POA: Diagnosis not present

## 2020-12-28 DIAGNOSIS — I10 Essential (primary) hypertension: Secondary | ICD-10-CM | POA: Diagnosis not present

## 2020-12-28 DIAGNOSIS — L97812 Non-pressure chronic ulcer of other part of right lower leg with fat layer exposed: Secondary | ICD-10-CM | POA: Insufficient documentation

## 2020-12-28 DIAGNOSIS — S81811D Laceration without foreign body, right lower leg, subsequent encounter: Secondary | ICD-10-CM | POA: Insufficient documentation

## 2020-12-28 DIAGNOSIS — L97819 Non-pressure chronic ulcer of other part of right lower leg with unspecified severity: Secondary | ICD-10-CM | POA: Diagnosis present

## 2020-12-29 NOTE — Progress Notes (Signed)
Tammy, Fitzpatrick ([id number]) Visit Report for 12/28/2020 Abuse/Suicide Risk Screen Details Patient Name: Date of Service: Tammy Fitzpatrick, Tammy Fitzpatrick 12/28/2020 2:45 PM Medical Record Number: [id number] Patient Account Number: [id number] Date of Birth/Sex: Treating RN: Mar 13, 1951 (70 y.o. Tammy Fitzpatrick Primary Care Sible Straley: PA Zenovia Jordan, NO Other Clinician: Referring Jaquesha Boroff: Treating Hatsuko Bizzarro/Extender: Skeet Simmer in Treatment: 0 Abuse/Suicide Risk Screen Items Answer ABUSE RISK SCREEN: Has anyone close to you tried to hurt or harm you recentlyo No Do you feel uncomfortable with anyone in your familyo No Has anyone forced you do things that you didnt want to doo No Electronic Signature(s) Signed: 12/28/2020 5:35:12 PM By: Shawn Stall Entered By: Shawn Stall on 12/28/2020 14:58:08 -------------------------------------------------------------------------------- Activities of Daily Living Details Patient Name: Date of Service: Tammy Fitzpatrick, Tammy Fitzpatrick 12/28/2020 2:45 PM Medical Record Number: [id number] Patient Account Number: [id number] Date of Birth/Sex: Treating RN: [birth year] (70 y.o. Tammy Fitzpatrick Primary Care Ashritha Desrosiers: PA Zenovia Jordan, NO Other Clinician: Referring Zamya Culhane: Treating Ziza Hastings/Extender: Skeet Simmer in Treatment: 0 Activities of Daily Living Items Answer Activities of Daily Living (Please select one for each item) Drive Automobile Completely Able T Medications ake Completely Able Use T elephone Completely Able Care for Appearance Completely Able Use T oilet Completely Able Bath / Shower Completely Able Dress Self Completely Able Feed Self Completely Able Walk Completely Able Get In / Out Bed Completely Able Housework Completely Able Prepare Meals Completely Able Handle Money Completely Able Shop for Self Completely Able Electronic Signature(s) Signed: 12/28/2020 5:35:12 PM By: Shawn Stall Entered By: Shawn Stall on 12/28/2020  14:58:26 -------------------------------------------------------------------------------- Education Screening Details Patient Name: Date of Service: [name], [name] 12/28/2020 2:45 PM Medical Record Number: [id number] Patient Account Number: [id number] Date of Birth/Sex: Treating RN: [birth year] (70 y.o. Tammy Fitzpatrick Primary Care Tammy Fitzpatrick: PA Zenovia Jordan, NO Other Clinician: Referring Kyleah Pensabene: Treating Keston Seever/Extender: Skeet Simmer in Treatment: 0 Primary Learner Assessed: Patient Learning Preferences/Education Level/Primary Language Learning Preference: Explanation, Demonstration, Printed Material Highest Education Level: College or Above Preferred Language: English Cognitive Barrier Language Barrier: No Translator Needed: No Memory Deficit: No Emotional Barrier: No Cultural/Religious Beliefs Affecting Medical Care: No Physical Barrier Impaired Vision: Yes Glasses, reading Knowledge/Comprehension Knowledge Level: High Comprehension Level: High Ability to understand written instructions: High Ability to understand verbal instructions: High Motivation Anxiety Level: Calm Education Importance: Acknowledges Need Interest in Health Problems: Asks Questions Perception: Coherent Willingness to Engage in Self-Management High Activities: Readiness to Engage in Self-Management High Activities: Electronic Signature(s) Signed: 12/28/2020 5:35:12 PM By: Shawn Stall Entered By: Shawn Stall on 12/28/2020 14:58:59 -------------------------------------------------------------------------------- Fall Risk Assessment Details Patient Name: Date of Service: Tammy Fitzpatrick, Tammy Fitzpatrick 12/28/2020 2:45 PM Medical Record Number: [id number] Patient Account Number: [id number] Date of Birth/Sex: Treating RN: Nov 03, 1950 (70 y.o. Debara Pickett, Millard.Loa Primary Care Franziska Podgurski: PA TIENT, NO Other Clinician: Referring Julissa Browning: Treating Laterria Lasota/Extender: Skeet Simmer in Treatment: 0 Fall  Risk Assessment Items Have you had 2 or more falls in the last 12 monthso 0 No Have you had any fall that resulted in injury in the last 12 monthso 0 No FALLS RISK SCREEN History of falling - immediate or within 3 months 0 No Secondary diagnosis (Do you have 2 or more medical diagnoseso) 0 No Ambulatory aid None/bed rest/wheelchair/nurse 0 Yes Crutches/cane/walker 0 No Furniture 0 No Intravenous therapy Access/Saline/Heparin Lock 0 No Gait/Transferring Normal/ bed rest/ wheelchair 0 Yes Weak (short steps with or without shuffle, stooped but able to lift head while walking, may seek 0 No support from  furniture) Impaired (short steps with shuffle, may have difficulty arising from chair, head down, impaired 0 No balance) Mental Status Oriented to own ability 0 Yes Electronic Signature(s) Signed: 12/28/2020 5:35:12 PM By: Shawn Stall Entered By: Shawn Stall on 12/28/2020 14:59:18 -------------------------------------------------------------------------------- Foot Assessment Details Patient Name: Date of Service: Tammy Fitzpatrick, Tammy Fitzpatrick 12/28/2020 2:45 PM Medical Record Number: [id number] Patient Account Number: [id number] Date of Birth/Sex: Treating RN: Oct 02, 1950 (70 y.o. Tammy Fitzpatrick Primary Care Jeydan Barner: PA TIENT, NO Other Clinician: Referring Dorella Laster: Treating Jaielle Dlouhy/Extender: Skeet Simmer in Treatment: 0 Foot Assessment Items Site Locations + = Sensation present, - = Sensation absent, C = Callus, U = Ulcer R = Redness, W = Warmth, M = Maceration, PU = Pre-ulcerative lesion F = Fissure, S = Swelling, D = Dryness Assessment Right: Left: Other Deformity: No No Prior Foot Ulcer: No No Prior Amputation: No No Charcot Joint: No No Ambulatory Status: Ambulatory Without Help Gait: Steady Electronic Signature(s) Signed: 12/28/2020 5:35:12 PM By: Shawn Stall Entered By: Shawn Stall on 12/28/2020  15:11:29 -------------------------------------------------------------------------------- Nutrition Risk Screening Details Patient Name: Date of Service: Tammy Fitzpatrick, Tammy Fitzpatrick 12/28/2020 2:45 PM Medical Record Number: [id number] Patient Account Number: [id number] Date of Birth/Sex: Treating RN: 1950-08-29 (70 y.o. Tammy Fitzpatrick Primary Care Lum Stillinger: PA Zenovia Jordan, NO Other Clinician: Referring Guy Seese: Treating Orey Moure/Extender: Skeet Simmer in Treatment: 0 Height (in): Weight (lbs): Body Mass Index (BMI): Nutrition Risk Screening Items Score Screening NUTRITION RISK SCREEN: I have an illness or condition that made me change the kind and/or amount of food I eat 2 Yes I eat fewer than two meals per day 0 No I eat few fruits and vegetables, or milk products 0 No I have three or more drinks of beer, liquor or wine almost every day 0 No I have tooth or mouth problems that make it hard for me to eat 0 No I don't always have enough money to buy the food I need 0 No I eat alone most of the time 0 No I take three or more different prescribed or over-the-counter drugs a day 0 No Without wanting to, I have lost or gained 10 pounds in the last six months 0 No I am not always physically able to shop, cook and/or feed myself 0 No Nutrition Protocols Good Risk Protocol 0 No interventions needed Moderate Risk Protocol High Risk Proctocol Risk Level: Good Risk Score: 2 Electronic Signature(s) Signed: 12/28/2020 5:35:12 PM By: Shawn Stall Entered By: Shawn Stall on 12/28/2020 14:59:29

## 2020-12-29 NOTE — Progress Notes (Signed)
[name], [name] ([id number]) Visit Report for 12/28/2020 Chief Complaint Document Details Patient Name: Date of Service: Tammy Fitzpatrick, Tammy Fitzpatrick 12/28/2020 2:45 PM Medical Record Number: [id number] Patient Account Number: [id number] Date of Birth/Sex: Treating RN: [birth year], Tammy Fitzpatrick (70 y.o. Roel Cluck Primary Care Provider: PA Zenovia Jordan, [place] Other Clinician: Referring Provider: Treating Provider/Extender: Skeet Simmer in Treatment: 0 Information Obtained from: Patient Chief Complaint Right leg ulcer following dog scratch Tammy 2022 Electronic Signature(s) Signed: 12/28/2020 4:45:18 PM By: Lenda Kelp PA-C Entered By: Lenda Kelp on 12/28/2020 16:45:18 -------------------------------------------------------------------------------- Debridement Details Patient Name: Date of Service: Tammy Fitzpatrick, Tammy Fitzpatrick 12/28/2020 2:45 PM Medical Record Number: [id number] Patient Account Number: [id number] Date of Birth/Sex: Treating RN: [birth year]ammy Fitzpatrick (70 y.o. Roel Cluck Primary Care Provider: PA Zenovia Jordan, NO Other Clinician: Referring Provider: Treating Provider/Extender: Skeet Simmer in Treatment: 0 Debridement Performed for Assessment: Wound #1 Right,Medial Lower Leg Performed By: Physician Lenda Kelp, PA Debridement Type: Debridement Level of Consciousness (Pre-procedure): Awake and Alert Pre-procedure Verification/Time Out Yes - 16:09 Taken: Start Time: 16:10 Pain Control: Lidocaine 5% topical ointment T Area Debrided (L x W): otal 2.5 (cm) x 4.5 (cm) = 11.25 (cm) Tissue and other material debrided: Non-Viable, Slough, Subcutaneous, Biofilm, Slough Level: Skin/Subcutaneous Tissue Debridement Description: Excisional Instrument: Curette Bleeding: Minimum Hemostasis Achieved: Pressure End Time: 16:14 Response to Treatment: Procedure was tolerated well Level of Consciousness (Post- Awake and Alert procedure): Post Debridement Measurements of Total Wound Length: (cm)  2.5 Width: (cm) 4.5 Depth: (cm) 0.3 Volume: (cm) 2.651 Character of Wound/Ulcer Post Debridement: Stable Post Procedure Diagnosis Same as Pre-procedure Electronic Signature(s) Signed: 12/28/2020 5:53:47 PM By: Antonieta Iba Signed: 12/28/2020 6:00:42 PM By: Lenda Kelp PA-C Entered By: Antonieta Iba on 12/28/2020 16:14:33 -------------------------------------------------------------------------------- HPI Details Patient Name: Date of Service: Tammy Fitzpatrick, Tammy Fitzpatrick 12/28/2020 2:45 PM Medical Record Number: [id number] Patient Account Number: [id number] Date of Birth/Sex: Treating RN: [birth year]ammy Fitzpatrick (70 y.o. Roel Cluck Primary Care Provider: PA Zenovia Jordan, NO Other Clinician: Referring Provider: Treating Provider/Extender: Skeet Simmer in Treatment: 0 History of Present Illness HPI Description: 12/28/2020 upon evaluation today patient presents for initial evaluation here in our clinic concerning a wound that she has over the right medial lower leg. This is subsequently a result of initially a trauma from her dog where he jumped up on her and caused a laceration. With that being said unfortunately the patient has been having issues with this since that time trying to get this healed. Specifically the initial injury occurred to Tammy 27, 2022. The sutures did seem to be his around 12/07/2020 she was placed on doxycycline. Following that time she has been using Xeroform and a border gauze dressing currently which again is keeping it covered but that is about it. With regard to medical history the patient does have what appears to be an obvious history of chronic venous insufficiency and lymphedema. She also has high blood pressure noted today. Electronic Signature(s) Signed: 12/28/2020 5:54:36 PM By: Lenda Kelp PA-C Entered By: Lenda Kelp on 12/28/2020 17:54:36 -------------------------------------------------------------------------------- Physical Exam Details Patient Name:  Date of Service: Tammy Fitzpatrick, Tammy Fitzpatrick 12/28/2020 2:45 PM Medical Record Number: [id number] Patient Account Number: [id number] Date of Birth/Sex: Treating RN: [birth year] (70 y.o. Roel Cluck Primary Care Provider: PA Zenovia Jordan, NO Other Clinician: Referring Provider: Treating Provider/Extender: Skeet Simmer in Treatment: 0 Constitutional patient is hypertensive.. pulse regular and within target range for patient.Marland Kitchen temperature within target range for patient.. Well-nourished and well-hydrated in no acute  distress. Eyes conjunctiva clear no eyelid edema noted. pupils equal round and reactive to light and accommodation. Ears, Nose, Mouth, and Throat no gross abnormality of ear auricles or external auditory canals. normal hearing noted during conversation. mucus membranes moist. Respiratory normal breathing without difficulty. Cardiovascular 2+ dorsalis pedis/posterior tibialis pulses. 1+ pitting edema of the bilateral lower extremities. Musculoskeletal normal gait and posture. no significant deformity or arthritic changes, no loss or range of motion, no clubbing. Psychiatric this patient is able to make decisions and demonstrates good insight into disease process. Alert and Oriented x 3. pleasant and cooperative. Notes Upon inspection patient's wound bed actually showed signs of some necrotic tissue noted on the surface of the wound the overall appearance of the wound is not very good at all unfortunately. With that being said I do believe that we can probably do something to clean this away today and I did recommend sharp debridement to try to clear away some of the necrotic debris and get to a much better surface to allow this to start filling in. I think coupled with compression that is probably be the best thing for her. The patient voiced understanding. I did perform sharp debridement to clear away the necrotic debris she tolerated that without complication postdebridement the  wound bed appears to be doing much better which is great news. Electronic Signature(s) Signed: 12/28/2020 5:55:19 PM By: Lenda Kelp PA-C Entered By: Lenda Kelp on 12/28/2020 17:55:19 -------------------------------------------------------------------------------- Physician Orders Details Patient Name: Date of Service: Tammy Fitzpatrick, Tammy Fitzpatrick 12/28/2020 2:45 PM Medical Record Number: [id number] Patient Account Number: [id number] Date of Birth/Sex: Treating RN: [birth year]ammy Fitzpatrick (70 y.o. Roel Cluck Primary Care Provider: PA TIENT, NO Other Clinician: Referring Provider: Treating Provider/Extender: Skeet Simmer in Treatment: 0 Verbal / Phone Orders: No Diagnosis Coding ICD-10 Coding Code Description I87.2 Venous insufficiency (chronic) (peripheral) I89.0 Lymphedema, not elsewhere classified L97.812 Non-pressure chronic ulcer of other part of right lower leg with fat layer exposed I10 Essential (primary) hypertension Follow-up Appointments ppointment in 1 week. - with [facility] Schwartz Return A Bathing/ Shower/ Hygiene May shower with protection but do not get wound dressing(s) wet. Edema Control - Lymphedema / SCD / Other Elevate legs to the level of the heart or above for 30 minutes daily and/or when sitting, a frequency of: Avoid standing for long periods of time. Additional Orders / Instructions Follow Nutritious Diet Wound Treatment Wound #1 - Lower Leg Wound Laterality: Right, Medial Cleanser: Soap and Water 1 x Per Week/Fitzpatrick Days Discharge Instructions: May shower and wash wound with dial antibacterial soap and water prior to dressing change. Cleanser: Wound Cleanser 1 x Per Week/Fitzpatrick Days Discharge Instructions: Cleanse the wound with wound cleanser prior to applying a clean dressing using gauze sponges, not tissue or cotton balls. Peri-Wound Care: Sween Lotion (Moisturizing lotion) 1 x Per Week/Fitzpatrick Days Discharge Instructions: Apply moisturizing lotion as directed Prim Dressing:  Hydrofera Blue Ready Foam, 2.5 x2.5 in 1 x Per Week/Fitzpatrick Days ary Discharge Instructions: Cut 2 smaller pieces for 2 deeper areas on sides then 1 in middle Secondary Dressing: ABD Pad, 5x9 1 x Per Week/Fitzpatrick Days Discharge Instructions: Apply over primary dressing as directed. Compression Wrap: ThreePress (3 layer compression wrap) 1 x Per Week/Fitzpatrick Days Discharge Instructions: Apply three layer compression as directed. Electronic Signature(s) Signed: 12/28/2020 5:53:47 PM By: Antonieta Iba Signed: 12/28/2020 6:00:42 PM By: Lenda Kelp PA-C Entered By: Antonieta Iba on 12/28/2020 16:21:19 -------------------------------------------------------------------------------- Problem List Details Patient Name: Date of Service: Tammy Fitzpatrick,  Tammy Fitzpatrick 12/28/2020 2:45 PM Medical Record Number: [id number] Patient Account Number: [id number] Date of Birth/Sex: Treating RN: [birth year]Tammy Fitzpatrick (70 y.o. Roel Cluck Primary Care Provider: PA Zenovia Jordan, NO Other Clinician: Referring Provider: Treating Provider/Extender: Skeet Simmer in Treatment: 0 Active Problems ICD-10 Encounter Code Description Active Date MDM Diagnosis I87.2 Venous insufficiency (chronic) (peripheral) 12/28/2020 No Yes I89.0 Lymphedema, not elsewhere classified 12/28/2020 No Yes L97.812 Non-pressure chronic ulcer of other part of right lower leg with fat layer 12/28/2020 No Yes exposed I10 Essential (primary) hypertension 12/28/2020 No Yes Inactive Problems Resolved Problems Electronic Signature(s) Signed: 12/28/2020 4:04:06 PM By: Lenda Kelp PA-C Entered By: Lenda Kelp on 12/28/2020 16:04:06 -------------------------------------------------------------------------------- Progress Note Details Patient Name: Date of Service: Tammy Fitzpatrick, Tammy Fitzpatrick 12/28/2020 2:45 PM Medical Record Number: [id number] Patient Account Number: [id number] Date of Birth/Sex: Treating RN: [birth year] (70 y.o. Roel Cluck Primary Care Provider: PA Zenovia Jordan,  [place] Other Clinician: Referring Provider: Treating Provider/Extender: Skeet Simmer in Treatment: 0 Subjective Chief Complaint Information obtained from Patient Right leg ulcer following dog scratch Tammy 2022 History of Present Illness (HPI) 12/28/2020 upon evaluation today patient presents for initial evaluation here in our clinic concerning a wound that she has over the right medial lower leg. This is subsequently a result of initially a trauma from her dog where he jumped up on her and caused a laceration. With that being said unfortunately the patient has been having issues with this since that time trying to get this healed. Specifically the initial injury occurred to Tammy 27, 2022. The sutures did seem to be his around 12/07/2020 she was placed on doxycycline. Following that time she has been using Xeroform and a border gauze dressing currently which again is keeping it covered but that is about it. With regard to medical history the patient does have what appears to be an obvious history of chronic venous insufficiency and lymphedema. She also has high blood pressure noted today. Patient History Allergies poison ivy extract (Reaction: hives) Family History Cancer - Father, Heart Disease - Paternal Grandparents, Hypertension - Paternal Grandparents,Father,Siblings, Thyroid Problems - Child, No family history of Diabetes, Hereditary Spherocytosis, Kidney Disease, Lung Disease, Seizures, Stroke, Tuberculosis. Social History Former smoker - quit 50 years ago, Marital Status - Divorced, Alcohol Use - Moderate - wine 2 glasses daily prior to injury, Drug Use - No History, Caffeine Use - Daily - coffee. Medical History Eyes Denies history of Cataracts, Glaucoma, Optic Neuritis Ear/Nose/Mouth/Throat Denies history of Chronic sinus problems/congestion, Middle ear problems Hematologic/Lymphatic Denies history of Anemia, Hemophilia, Human Immunodeficiency Virus, Lymphedema, Sickle Cell  Disease Respiratory Denies history of Aspiration, Asthma, Chronic Obstructive Pulmonary Disease (COPD), Pneumothorax, Sleep Apnea, Tuberculosis Cardiovascular Patient has history of Hypertension Denies history of Angina, Arrhythmia, Congestive Heart Failure, Coronary Artery Disease, Deep Vein Thrombosis, Hypotension, Myocardial Infarction, Peripheral Arterial Disease, Peripheral Venous Disease, Phlebitis, Vasculitis Gastrointestinal Denies history of Cirrhosis , Colitis, Crohnoos, Hepatitis A, Hepatitis B, Hepatitis C Endocrine Denies history of Type I Diabetes, Type II Diabetes Genitourinary Denies history of End Stage Renal Disease Immunological Denies history of Lupus Erythematosus, Raynaudoos, Scleroderma Musculoskeletal Patient has history of Osteoarthritis Denies history of Gout, Rheumatoid Arthritis, Osteomyelitis Neurologic Denies history of Dementia, Neuropathy, Quadriplegia, Paraplegia, Seizure Disorder Oncologic Denies history of Received Chemotherapy, Received Radiation Psychiatric Denies history of Anorexia/bulimia, Confinement Anxiety Medical A Surgical History Notes nd Constitutional Symptoms (General Health) cellulitis 2020 [place] Review of Systems (ROS) Constitutional Symptoms (General Health) Denies complaints or symptoms of Fatigue, Fever,  Chills, Marked Weight Change. Eyes Complains or has symptoms of Glasses / Contacts - reading. Denies complaints or symptoms of Dry Eyes, Vision Changes. Ear/Nose/Mouth/Throat Denies complaints or symptoms of Chronic sinus problems or rhinitis. Respiratory Denies complaints or symptoms of Chronic or frequent coughs, Shortness of Breath. Cardiovascular Denies complaints or symptoms of Chest pain. Gastrointestinal Denies complaints or symptoms of Frequent diarrhea, Nausea, Vomiting. Endocrine Denies complaints or symptoms of Heat/cold intolerance. Genitourinary Denies complaints or symptoms of Frequent  urination. Integumentary (Skin) Complains or has symptoms of Wounds - right leg. Musculoskeletal Denies complaints or symptoms of Muscle Pain, Muscle Weakness. Neurologic Denies complaints or symptoms of Numbness/parasthesias. Psychiatric Denies complaints or symptoms of Claustrophobia, Suicidal. Objective Constitutional patient is hypertensive.. pulse regular and within target range for patient.Marland Kitchen temperature within target range for patient.. Well-nourished and well-hydrated in no acute distress. Vitals Time Taken: 2:57 PM, Height: 69 in, Source: Stated, Weight: 250 lbs, Source: Stated, BMI: 36.9, Temperature: 98.8 F, Pulse: 114 bpm, Respiratory Rate: 20 breaths/min, Blood Pressure: 181/74 mmHg. Eyes conjunctiva clear no eyelid edema noted. pupils equal round and reactive to light and accommodation. Ears, Nose, Mouth, and Throat no gross abnormality of ear auricles or external auditory canals. normal hearing noted during conversation. mucus membranes moist. Respiratory normal breathing without difficulty. Cardiovascular 2+ dorsalis pedis/posterior tibialis pulses. 1+ pitting edema of the bilateral lower extremities. Musculoskeletal normal gait and posture. no significant deformity or arthritic changes, no loss or range of motion, no clubbing. Psychiatric this patient is able to make decisions and demonstrates good insight into disease process. Alert and Oriented x 3. pleasant and cooperative. General Notes: Upon inspection patient's wound bed actually showed signs of some necrotic tissue noted on the surface of the wound the overall appearance of the wound is not very good at all unfortunately. With that being said I do believe that we can probably do something to clean this away today and I did recommend sharp debridement to try to clear away some of the necrotic debris and get to a much better surface to allow this to start filling in. I think coupled with compression that is  probably be the best thing for her. The patient voiced understanding. I did perform sharp debridement to clear away the necrotic debris she tolerated that without complication postdebridement the wound bed appears to be doing much better which is great news. Integumentary (Hair, Skin) Wound #1 status is Open. Original cause of wound was Trauma. The date acquired was: 09/21/2020. The wound is located on the Right,Medial Lower Leg. The wound measures 2.5cm length x 4.5cm width x 0.3cm depth; 8.836cm^2 area and 2.651cm^3 volume. Assessment Active Problems ICD-10 Venous insufficiency (chronic) (peripheral) Lymphedema, not elsewhere classified Non-pressure chronic ulcer of other part of right lower leg with fat layer exposed Essential (primary) hypertension Procedures Wound #1 Pre-procedure diagnosis of Wound #1 is a Lymphedema located on the Right,Medial Lower Leg . There was a Excisional Skin/Subcutaneous Tissue Debridement with a total area of 11.25 sq cm performed by Lenda Kelp, PA. With the following instrument(s): Curette to remove Non-Viable tissue/material. Material removed includes Subcutaneous Tissue, Slough, and Biofilm after achieving pain control using Lidocaine 5% topical ointment. No specimens were taken. A time out was conducted at 16:09, prior to the start of the procedure. A Minimum amount of bleeding was controlled with Pressure. The procedure was tolerated well. Post Debridement Measurements: 2.5cm length x 4.5cm width x 0.3cm depth; 2.651cm^3 volume. Character of Wound/Ulcer Post Debridement is stable. Post procedure Diagnosis  Wound #1: Same as Pre-Procedure Pre-procedure diagnosis of Wound #1 is a Lymphedema located on the Right,Medial Lower Leg . There was a Three Layer Compression Therapy Procedure by Antonieta Iba, RN. Post procedure Diagnosis Wound #1: Same as Pre-Procedure Plan Follow-up Appointments: Return Appointment in 1 week. - with Luana Shu Shower/  Hygiene: May shower with protection but do not get wound dressing(s) wet. Edema Control - Lymphedema / SCD / Other: Elevate legs to the level of the heart or above for 30 minutes daily and/or when sitting, a frequency of: Avoid standing for long periods of time. Additional Orders / Instructions: Follow Nutritious Diet WOUND #1: - Lower Leg Wound Laterality: Right, Medial Cleanser: Soap and Water 1 x Per Week/Fitzpatrick Days Discharge Instructions: May shower and wash wound with dial antibacterial soap and water prior to dressing change. Cleanser: Wound Cleanser 1 x Per Week/Fitzpatrick Days Discharge Instructions: Cleanse the wound with wound cleanser prior to applying a clean dressing using gauze sponges, not tissue or cotton balls. Peri-Wound Care: Sween Lotion (Moisturizing lotion) 1 x Per Week/Fitzpatrick Days Discharge Instructions: Apply moisturizing lotion as directed Prim Dressing: Hydrofera Blue Ready Foam, 2.5 x2.5 in 1 x Per Week/Fitzpatrick Days ary Discharge Instructions: Cut 2 smaller pieces for 2 deeper areas on sides then 1 in middle Secondary Dressing: ABD Pad, 5x9 1 x Per Week/Fitzpatrick Days Discharge Instructions: Apply over primary dressing as directed. Com pression Wrap: ThreePress (3 layer compression wrap) 1 x Per Week/Fitzpatrick Days Discharge Instructions: Apply three layer compression as directed. 1. I would recommend currently that we go ahead and initiate treatment with [facility] I think this can be the best option. 2. I am also can recommend that we initiate treatment with a 3 layer compression wrap after applying an ABD pad to cover. 3. I would also recommend the patient elevate her legs much as possible but I think the compression will go a long way to help him with getting this moving in the right direction. We will see patient back for reevaluation in 1 week here in the clinic. If anything worsens or changes patient will contact our office for additional recommendations. Electronic  Signature(s) Signed: 12/28/2020 5:56:09 PM By: Lenda Kelp PA-C Entered By: Lenda Kelp on 12/28/2020 17:56:08 -------------------------------------------------------------------------------- HxROS Details Patient Name: Date of Service: Tammy Fitzpatrick, Tammy Fitzpatrick 12/28/2020 2:45 PM Medical Record Number: [id number] Patient Account Number: [id number] Date of Birth/Sex: Treating RN: Tammy Fitzpatrick, Tammy Fitzpatrick (70 y.o. [name] Primary Care Provider: PA Zenovia Jordan, NO Other Clinician: Referring Provider: Treating Provider/Extender: Skeet Simmer in Treatment: 0 Constitutional Symptoms (General Health) Complaints and Symptoms: Negative for: Fatigue; Fever; Chills; Marked Weight Change Medical History: Past Medical History Notes: cellulitis 2020 [place] Eyes Complaints and Symptoms: Positive for: Glasses / Contacts - reading Negative for: Dry Eyes; Vision Changes Medical History: Negative for: Cataracts; Glaucoma; Optic Neuritis Ear/Nose/Mouth/Throat Complaints and Symptoms: Negative for: Chronic sinus problems or rhinitis Medical History: Negative for: Chronic sinus problems/congestion; Middle ear problems Respiratory Complaints and Symptoms: Negative for: Chronic or frequent coughs; Shortness of Breath Medical History: Negative for: Aspiration; Asthma; Chronic Obstructive Pulmonary Disease (COPD); Pneumothorax; Sleep Apnea; Tuberculosis Cardiovascular Complaints and Symptoms: Negative for: Chest pain Medical History: Positive for: Hypertension Negative for: Angina; Arrhythmia; Congestive Heart Failure; Coronary Artery Disease; Deep Vein Thrombosis; Hypotension; Myocardial Infarction; Peripheral Arterial Disease; Peripheral Venous Disease; Phlebitis; Vasculitis Gastrointestinal Complaints and Symptoms: Negative for: Frequent diarrhea; Nausea; Vomiting Medical History: Negative for: Cirrhosis ; Colitis; Crohns; Hepatitis A; Hepatitis B; Hepatitis C  Endocrine Complaints and  Symptoms: Negative for: Heat/cold intolerance Medical History: Negative for: Type I Diabetes; Type II Diabetes Genitourinary Complaints and Symptoms: Negative for: Frequent urination Medical History: Negative for: End Stage Renal Disease Integumentary (Skin) Complaints and Symptoms: Positive for: Wounds - right leg Musculoskeletal Complaints and Symptoms: Negative for: Muscle Pain; Muscle Weakness Medical History: Positive for: Osteoarthritis Negative for: Gout; Rheumatoid Arthritis; Osteomyelitis Neurologic Complaints and Symptoms: Negative for: Numbness/parasthesias Medical History: Negative for: Dementia; Neuropathy; Quadriplegia; Paraplegia; Seizure Disorder Psychiatric Complaints and Symptoms: Negative for: Claustrophobia; Suicidal Medical History: Negative for: Anorexia/bulimia; Confinement Anxiety Hematologic/Lymphatic Medical History: Negative for: Anemia; Hemophilia; Human Immunodeficiency Virus; Lymphedema; Sickle Cell Disease Immunological Medical History: Negative for: Lupus Erythematosus; Raynauds; Scleroderma Oncologic Medical History: Negative for: Received Chemotherapy; Received Radiation Immunizations Pneumococcal Vaccine: Received Pneumococcal Vaccination: No Implantable Devices No devices added Family and Social History Cancer: Yes - Father; Diabetes: No; Heart Disease: Yes - Paternal Grandparents; Hereditary Spherocytosis: No; Hypertension: Yes - Paternal Grandparents,Father,Siblings; Kidney Disease: No; Lung Disease: No; Seizures: No; Stroke: No; Thyroid Problems: Yes - Child; Tuberculosis: No; Former smoker - quit 50 years ago; Marital Status - Divorced; Alcohol Use: Moderate - wine 2 glasses daily prior to injury; Drug Use: No History; Caffeine Use: Daily - coffee; Financial Concerns: No; Food, Clothing or Shelter Needs: No; Support System Lacking: No; Transportation Concerns: No Electronic Signature(s) Signed: 12/28/2020 5:35:12 PM By: Shawn Stalleaton,  Bobbi Signed: 12/28/2020 6:00:42 PM By: Lenda KelpStone III, Kareli Hossain PA-C Entered By: Shawn Stalleaton, Bobbi on 12/28/2020 Fitzpatrick:07:47 -------------------------------------------------------------------------------- SuperBill Details Patient Name: Date of Service: Tammy Fitzpatrick, Tammy Fitzpatrick 12/28/2020 Medical Record Number: [id number] Patient Account Number: [id number] Date of Birth/Sex: Treating RN: [birth year] (70 y.o. Roel CluckF) Barnhart, Jodi Primary Care Provider: PA Zenovia JordanIENT, NO Other Clinician: Referring Provider: Treating Provider/Extender: Skeet SimmerStone III, Alysah Carton Weeks in Treatment: 0 Diagnosis Coding ICD-10 Codes Code Description I87.2 Venous insufficiency (chronic) (peripheral) I89.0 Lymphedema, not elsewhere classified L97.812 Non-pressure chronic ulcer of other part of right lower leg with fat layer exposed I10 Essential (primary) hypertension Facility Procedures CPT4 Code: [id number] Description: 99213 - WOUND CARE VISIT-LEV 3 EST PT Modifier: 25 Quantity: 1 CPT4 Code: [id number] Description: 11042 - DEB SUBQ TISSUE 20 SQ CM/< ICD-10 Diagnosis Description L97.812 Non-pressure chronic ulcer of other part of right lower leg with fat layer expos Modifier: ed Quantity: 1 Physician Procedures : CPT4 Code Description Modifier [id number] 99204 - WC PHYS LEVEL 4 - NEW PT 25 ICD-10 Diagnosis Description I87.2 Venous insufficiency (chronic) (peripheral) I89.0 Lymphedema, not elsewhere classified L97.812 Non-pressure chronic ulcer of other part of  right lower leg with fat layer exposed I10 Essential (primary) hypertension Quantity: 1 Electronic Signature(s) Signed: 12/28/2020 5:57:13 PM By: Lenda KelpStone III, Coal Nearhood PA-C Previous Signature: 12/28/2020 5:53:47 PM Version By: Antonieta IbaBarnhart, Jodi Entered By: Lenda KelpStone III, Mei Suits on 12/28/2020 17:57:13

## 2021-01-05 NOTE — Progress Notes (Signed)
Ann MakiBISSELL, Madai ([id number]) Visit Report for 12/28/2020 Allergy List Details Patient Name: Date of Service: Mardee PostinBISSELL, V [place] 12/28/2020 2:45 PM Medical Record Number: [id number] Patient Account Number: [id number] Date of Birth/Sex: Treating RN: 1950/09/15 (70 y.o. Arta SilenceF) Deaton, Bobbi Primary Care Kristoph Sattler: PA Zenovia JordanIENT, [place] Other Clinician: Referring Alanni Vader: Treating Lesleyanne Politte/Extender: Skeet SimmerStone III, Hoyt Weeks in Treatment: 0 Allergies Active Allergies poison ivy extract Reaction: hives Allergy Notes Electronic Signature(s) Signed: 12/28/2020 5:35:12 PM By: Shawn Stalleaton, Bobbi Entered By: Shawn Stalleaton, Bobbi on 12/28/2020 14:58:00 -------------------------------------------------------------------------------- Arrival Information Details Patient Name: Date of Service: Mardee PostinBISSELL, V [place] 12/28/2020 2:45 PM Medical Record Number: [id number] Patient Account Number: [id number] Date of Birth/Sex: Treating RN: 1950/09/15 (70 y.o. Arta SilenceF) Deaton, Bobbi Primary Care Nikolaus Pienta: PA Zenovia JordanIENT, NO Other Clinician: Referring Nichael Ehly: Treating Ryen Rhames/Extender: Skeet SimmerStone III, Hoyt Weeks in Treatment: 0 Visit Information Patient Arrived: Ambulatory Arrival Time: 14:53 Accompanied By: self Transfer Assistance: None Patient Identification Verified: Yes Secondary Verification Process Completed: Yes Patient Requires Transmission-Based Precautions: No Patient Has Alerts: No Electronic Signature(s) Signed: 12/28/2020 5:35:12 PM By: Shawn Stalleaton, Bobbi Entered By: Shawn Stalleaton, Bobbi on 12/28/2020 14:57:28 -------------------------------------------------------------------------------- Clinic Level of Care Assessment Details Patient Name: Date of Service: Mardee PostinBISSELL, V [place] 12/28/2020 2:45 PM Medical Record Number: [id number] Patient Account Number: [id number] Date of Birth/Sex: Treating RN: 1950/09/15 (70 y.o. Roel CluckF) Barnhart, Jodi Primary Care Edit Ricciardelli: PA Zenovia JordanIENT, NO Other Clinician: Referring Octaviano Mukai: Treating Graycie Halley/Extender: Skeet SimmerStone III,  Hoyt Weeks in Treatment: 0 Clinic Level of Care Assessment Items TOOL 1 Quantity Score X- 1 0 Use when EandM and Procedure is performed on INITIAL visit ASSESSMENTS - Nursing Assessment / Reassessment X- 1 20 General Physical Exam (combine w/ comprehensive assessment (listed just below) when performed on new pt. evals) X- 1 25 Comprehensive Assessment (HX, ROS, Risk Assessments, Wounds Hx, etc.) ASSESSMENTS - Wound and Skin Assessment / Reassessment []  - 0 Dermatologic / Skin Assessment (not related to wound area) ASSESSMENTS - Ostomy and/or Continence Assessment and Care []  - 0 Incontinence Assessment and Management []  - 0 Ostomy Care Assessment and Management (repouching, etc.) PROCESS - Coordination of Care []  - 0 Simple Patient / Family Education for ongoing care X- 1 20 Complex (extensive) Patient / Family Education for ongoing care X- 1 10 Staff obtains ChiropractorConsents, Records, T Results / Process Orders est []  - 0 Staff telephones HHA, Nursing Homes / Clarify orders / etc []  - 0 Routine Transfer to another Facility (non-emergent condition) []  - 0 Routine Hospital Admission (non-emergent condition) []  - 0 New Admissions / Manufacturing engineernsurance Authorizations / Ordering NPWT Apligraf, etc. , []  - 0 Emergency Hospital Admission (emergent condition) PROCESS - Special Needs []  - 0 Pediatric / Minor Patient Management []  - 0 Isolation Patient Management []  - 0 Hearing / Language / Visual special needs []  - 0 Assessment of Community assistance (transportation, D/C planning, etc.) []  - 0 Additional assistance / Altered mentation []  - 0 Support Surface(s) Assessment (bed, cushion, seat, etc.) INTERVENTIONS - Miscellaneous []  - 0 External ear exam []  - 0 Patient Transfer (multiple staff / Nurse, adultHoyer Lift / Similar devices) []  - 0 Simple Staple / Suture removal (25 or less) []  - 0 Complex Staple / Suture removal (26 or more) []  - 0 Hypo/Hyperglycemic Management (do not check if  billed separately) X- 1 15 Ankle / Brachial Index (ABI) - do not check if billed separately Has the patient been seen at the hospital within the last three years: Yes Total Score: 90 Level Of Care: New/Established - Level 3 Electronic Signature(s) Signed: 12/28/2020 5:53:47 PM  By: Bo Mcclintock By: Antonieta Iba on 12/28/2020 16:19:42 -------------------------------------------------------------------------------- Compression Therapy Details Patient Name: Date of Service: QUANEISHA, HANISCH 12/28/2020 2:45 PM Medical Record Number: [id number] Patient Account Number: [id number] Date of Birth/Sex: Treating RN: 1950-06-27 (70 y.o. Roel Cluck Primary Care Aryanah Enslow: PA Zenovia Jordan, NO Other Clinician: Referring Nayden Czajka: Treating Moussa Wiegand/Extender: Skeet Simmer in Treatment: 0 Compression Therapy Performed for Wound Assessment: Wound #1 Right,Medial Lower Leg Performed By: Clinician Antonieta Iba, RN Compression Type: Three Layer Post Procedure Diagnosis Same as Pre-procedure Electronic Signature(s) Signed: 12/28/2020 5:53:47 PM By: Antonieta Iba Entered By: Antonieta Iba on 12/28/2020 16:15:17 -------------------------------------------------------------------------------- Encounter Discharge Information Details Patient Name: Date of Service: JENAVEVE, FENSTERMAKER 12/28/2020 2:45 PM Medical Record Number: [id number] Patient Account Number: [id number] Date of Birth/Sex: Treating RN: 06-Mar-1951 (70 y.o. [name] Primary Care Bellanie Matthew: PA Zenovia Jordan, NO Other Clinician: Referring Jaimi Belle: Treating Ryder Man/Extender: Skeet Simmer in Treatment: 0 Encounter Discharge Information Items Post Procedure Vitals Discharge Condition: Stable Temperature (F): 98.8 Ambulatory Status: Ambulatory Pulse (bpm): 114 Discharge Destination: Home Respiratory Rate (breaths/min): 20 Transportation: Private Auto Blood Pressure (mmHg): 181/74 Accompanied By: self Schedule  Follow-up Appointment: Yes Clinical Summary of Care: Electronic Signature(s) Signed: 12/28/2020 5:35:12 PM By: Shawn Stall Entered By: Shawn Stall on 12/28/2020 16:50:05 -------------------------------------------------------------------------------- Lower Extremity Assessment Details Patient Name: Date of Service: ELLISSA, AYO 12/28/2020 2:45 PM Medical Record Number: [id number] Patient Account Number: [id number] Date of Birth/Sex: Treating RN: 1950-09-18 (70 y.o. Debara Pickett, Millard.Loa Primary Care Caramia Boutin: PA TIENT, NO Other Clinician: Referring Skyler Carel: Treating Glenden Rossell/Extender: Skeet Simmer in Treatment: 0 Edema Assessment Assessed: [Left: No] [Right: Yes] Edema: [Left: Ye] [Right: s] Calf Left: Right: Point of Measurement: 29 cm From Medial Instep 50 cm Ankle Left: Right: Point of Measurement: 9 cm From Medial Instep 34.5 cm Knee To Floor Left: Right: From Medial Instep 41 cm Vascular Assessment Pulses: Dorsalis Pedis Palpable: [Right:Yes] Doppler Audible: [Right:Yes] Posterior Tibial Palpable: [Right:Yes Yes] Notes ABI noncompressible >298mmHg. Electronic Signature(s) Signed: 12/28/2020 5:35:12 PM By: Shawn Stall Entered By: Shawn Stall on 12/28/2020 15:19:45 -------------------------------------------------------------------------------- Multi-Disciplinary Care Plan Details Patient Name: Date of Service: RONIESHA, HOLLINGSHEAD 12/28/2020 2:45 PM Medical Record Number: [id number] Patient Account Number: [id number] Date of Birth/Sex: Treating RN: 05-05-1951 (70 y.o. Roel Cluck Primary Care Aala Ransom: PA Zenovia Jordan, NO Other Clinician: Referring Demeisha Geraghty: Treating Patrycja Mumpower/Extender: Skeet Simmer in Treatment: 0 Active Inactive Orientation to the Wound Care Program Nursing Diagnoses: Knowledge deficit related to the wound healing center program Goals: Patient/caregiver will verbalize understanding of the Wound Healing Center  Program Date Initiated: 12/28/2020 Target Resolution Date: 01/25/2021 Goal Status: Active Interventions: Provide education on orientation to the wound center Notes: Wound/Skin Impairment Nursing Diagnoses: Impaired tissue integrity Goals: Patient/caregiver will verbalize understanding of skin care regimen Date Initiated: 12/28/2020 Target Resolution Date: 01/25/2021 Goal Status: Active Ulcer/skin breakdown will have a volume reduction of 30% by week 4 Date Initiated: 12/28/2020 Target Resolution Date: 01/25/2021 Goal Status: Active Interventions: Assess patient/caregiver ability to obtain necessary supplies Assess patient/caregiver ability to perform ulcer/skin care regimen upon admission and as needed Assess ulceration(s) every visit Provide education on ulcer and skin care Treatment Activities: Topical wound management initiated : 12/28/2020 Notes: Electronic Signature(s) Signed: 12/28/2020 4:07:34 PM By: Antonieta Iba Entered By: Antonieta Iba on 12/28/2020 16:07:33 -------------------------------------------------------------------------------- Pain Assessment Details Patient Name: Date of Service: QUANTAVIA, FRITH 12/28/2020 2:45 PM Medical Record Number: [id number] Patient Account Number: [id number] Date of Birth/Sex: Treating RN: 10-04-1950 (70 y.o. F)  Shawn Stall Primary Care Ayuub Penley: PA Zenovia Jordan, NO Other Clinician: Referring Lawrence Mitch: Treating Alexanderjames Berg/Extender: Skeet Simmer in Treatment: 0 Active Problems Location of Pain Severity and Description of Pain Patient Has Paino No Site Locations Rate the pain. Current Pain Level: 0 Pain Management and Medication Current Pain Management: Medication: No Cold Application: No Rest: No Massage: No Activity: No T.E.N.S.: No Heat Application: No Leg drop or elevation: No Is the Current Pain Management Adequate: Adequate How does your wound impact your activities of daily livingo Sleep: No Bathing: No Appetite:  No Relationship With Others: No Bladder Continence: No Emotions: No Bowel Continence: No Work: No Toileting: No Drive: No Dressing: No Hobbies: No Electronic Signature(s) Signed: 12/28/2020 5:35:12 PM By: Shawn Stall Entered By: Shawn Stall on 12/28/2020 15:11:40 -------------------------------------------------------------------------------- Patient/Caregiver Education Details Patient Name: Date of Service: Mardee Postin 8/8/2022andnbsp2:45 PM Medical Record Number: [id number] Patient Account Number: [id number] Date of Birth/Gender: Treating RN: 1951-02-19 (70 y.o. Roel Cluck Primary Care Physician: PA Zenovia Jordan, NO Other Clinician: Referring Physician: Treating Physician/Extender: Skeet Simmer in Treatment: 0 Education Assessment Education Provided To: Patient Education Topics Provided Venous: Methods: Explain/Verbal, Printed Responses: State content correctly Welcome T [facility]: o Methods: Explain/Verbal, Printed Responses: State content correctly Wound/Skin Impairment: Methods: Explain/Verbal, Printed Responses: State content correctly Electronic Signature(s) Signed: 12/28/2020 5:53:47 PM By: Antonieta Iba Entered By: Antonieta Iba on 12/28/2020 16:07:57 -------------------------------------------------------------------------------- Wound Assessment Details Patient Name: Date of Service: RAYNETTE, ARRAS 12/28/2020 2:45 PM Medical Record Number: [id number] Patient Account Number: [id number] Date of Birth/Sex: Treating RN: [birth year] (70 y.o. Roel Cluck Primary Care Koran Seabrook: PA Zenovia Jordan, NO Other Clinician: Referring Miyah Hampshire: Treating Raihan Kimmel/Extender: Skeet Simmer in Treatment: 0 Wound Status Wound Number: 1 Primary Etiology: Lymphedema Wound Location: Right, Medial Lower Leg Wound Status: Open Wounding Event: Trauma Date Acquired: 09/21/2020 Weeks Of Treatment: 0 Clustered Wound: No Photos Wound  Measurements Length: (cm) Width: (cm) Depth: (cm) Area: (cm) Volume: (cm) 2.5 % Reduction in Area: 4.5 % Reduction in Volume: 0.3 8.836 2.651 Wound Description Classification: Full Thickness Without Exposed Support Structur es Treatment Notes Wound #1 (Lower Leg) Wound Laterality: Right, Medial Cleanser Soap and Water Discharge Instruction: May shower and wash wound with dial antibacterial soap and water prior to dressing change. Wound Cleanser Discharge Instruction: Cleanse the wound with wound cleanser prior to applying a clean dressing using gauze sponges, not tissue or cotton balls. Peri-Wound Care Sween Lotion (Moisturizing lotion) Discharge Instruction: Apply moisturizing lotion as directed Topical Primary Dressing Hydrofera Blue Ready Foam, 2.5 x2.5 in Discharge Instruction: Cut 2 smaller pieces for 2 deeper areas on sides then 1 in middle Secondary Dressing ABD Pad, 5x9 Discharge Instruction: Apply over primary dressing as directed. Secured With Compression Wrap ThreePress (3 layer compression wrap) Discharge Instruction: Apply three layer compression as directed. Compression Stockings Add-Ons Electronic Signature(s) Signed: 12/28/2020 5:53:47 PM By: Antonieta Iba Signed: 01/05/2021 11:33:27 AM By: Karl Ito Entered By: Karl Ito on 12/28/2020 15:07:55 -------------------------------------------------------------------------------- Vitals Details Patient Name: Date of Service: ASHWINI, JAGO 12/28/2020 2:45 PM Medical Record Number: [id number] Patient Account Number: [id number] Date of Birth/Sex: Treating RN: 17-Oct-1950 (70 y.o. Debara Pickett, Millard.Loa Primary Care Jovonne Wilton: PA TIENT, NO Other Clinician: Referring Jairy Angulo: Treating Haleigh Desmith/Extender: Skeet Simmer in Treatment: 0 Vital Signs Time Taken: 14:57 Temperature (F): 98.8 Height (in): 69 Pulse (bpm): 114 Source: Stated Respiratory Rate (breaths/min): 20 Weight (lbs):  250 Blood Pressure (mmHg): 181/74 Source: Stated Reference Range: 80 - 120 mg /  dl Body Mass Index (BMI): 36.9 Electronic Signature(s) Signed: 12/28/2020 5:35:12 PM By: Shawn Stall Entered By: Shawn Stall on 12/28/2020 15:20:02

## 2021-01-06 ENCOUNTER — Encounter (HOSPITAL_BASED_OUTPATIENT_CLINIC_OR_DEPARTMENT_OTHER): Payer: Medicare Other | Admitting: Physician Assistant

## 2021-01-06 ENCOUNTER — Other Ambulatory Visit: Payer: Self-pay

## 2021-01-06 DIAGNOSIS — L97812 Non-pressure chronic ulcer of other part of right lower leg with fat layer exposed: Secondary | ICD-10-CM | POA: Diagnosis not present

## 2021-01-06 NOTE — Progress Notes (Signed)
AUNIKA, KIRSTEN ([id number]) Visit Report for 01/06/2021 Arrival Information Details Patient Name: Date of Service: Tammy Fitzpatrick, Tammy Fitzpatrick 01/06/2021 8:00 A M Medical Record Number: [id number] Patient Account Number: [id number] Date of Birth/Sex: Treating RN: Oct 12, 1950 (70 y.o. Ardis Rowan, Lauren Primary Care Leilynn Pilat: PA TIENT, NO Other Clinician: Referring Jonette Wassel: Treating Radie Berges/Extender: Skeet Simmer in Treatment: 1 Visit Information History Since Last Visit Added or deleted any medications: No Patient Arrived: Ambulatory Any new allergies or adverse reactions: No Arrival Time: 07:57 Had a fall or experienced change in No Accompanied By: self activities of daily living that may affect Transfer Assistance: None risk of falls: Patient Identification Verified: Yes Signs or symptoms of abuse/neglect since last visito No Secondary Verification Process Completed: Yes Hospitalized since last visit: No Patient Requires Transmission-Based Precautions: No Implantable device outside of the clinic excluding No Patient Has Alerts: No cellular tissue based products placed in the center since last visit: Has Dressing in Place as Prescribed: Yes Pain Present Now: No Electronic Signature(s) Signed: 01/06/2021 5:20:51 PM By: Fonnie Mu RN Entered By: Fonnie Mu on 01/06/2021 07:59:43 -------------------------------------------------------------------------------- Compression Therapy Details Patient Name: Date of Service: Tammy Fitzpatrick, Tammy Fitzpatrick 01/06/2021 8:00 A M Medical Record Number: [id number] Patient Account Number: [id number] Date of Birth/Sex: Treating RN: November 05, 1950 (70 y.o. Toniann Fail Primary Care Yiselle Babich: PA Zenovia Jordan, NO Other Clinician: Referring Mavery Milling: Treating Adina Puzzo/Extender: Skeet Simmer in Treatment: 1 Compression Therapy Performed for Wound Assessment: Wound #1 Right,Medial Lower Leg Performed By: Clinician Fonnie Mu,  RN Compression Type: Three Layer Post Procedure Diagnosis Same as Pre-procedure Electronic Signature(s) Signed: 01/06/2021 5:20:51 PM By: Fonnie Mu RN Entered By: Fonnie Mu on 01/06/2021 08:32:02 -------------------------------------------------------------------------------- Encounter Discharge Information Details Patient Name: Date of Service: Tammy Fitzpatrick, Tammy Fitzpatrick 01/06/2021 8:00 A M Medical Record Number: [id number] Patient Account Number: [id number] Date of Birth/Sex: Treating RN: Dec 21, 1950 (70 y.o. Ardis Rowan, Lauren Primary Care Francess Mullen: PA Zenovia Jordan, NO Other Clinician: Referring Reily Ilic: Treating Johonna Binette/Extender: Skeet Simmer in Treatment: 1 Encounter Discharge Information Items Discharge Condition: Stable Ambulatory Status: Ambulatory Discharge Destination: Home Transportation: Private Auto Accompanied By: self Schedule Follow-up Appointment: Yes Clinical Summary of Care: Patient Declined Electronic Signature(s) Signed: 01/06/2021 5:20:51 PM By: Fonnie Mu RN Entered By: Fonnie Mu on 01/06/2021 08:33:12 -------------------------------------------------------------------------------- Lower Extremity Assessment Details Patient Name: Date of Service: Tammy Fitzpatrick, Tammy Fitzpatrick 01/06/2021 8:00 A M Medical Record Number: [id number] Patient Account Number: [id number] Date of Birth/Sex: Treating RN: 10-02-1950 (70 y.o. Ardis Rowan, Lauren Primary Care Kleo Dungee: PA Zenovia Jordan, NO Other Clinician: Referring Brodi Nery: Treating Alice Burnside/Extender: Skeet Simmer in Treatment: 1 Edema Assessment Assessed: [Left: No] [Right: Yes] Edema: [Left: Ye] [Right: s] Calf Left: Right: Point of Measurement: 29 cm From Medial Instep 50 cm Ankle Left: Right: Point of Measurement: 9 cm From Medial Instep 34.5 cm Vascular Assessment Pulses: Dorsalis Pedis Palpable: [Right:Yes] Posterior Tibial Palpable: [Right:Yes] Electronic Signature(s) Signed:  01/06/2021 5:20:51 PM By: Fonnie Mu RN Entered By: Fonnie Mu on 01/06/2021 08:10:50 -------------------------------------------------------------------------------- Multi-Disciplinary Care Plan Details Patient Name: Date of Service: Tammy Fitzpatrick, Tammy Fitzpatrick 01/06/2021 8:00 A M Medical Record Number: [id number] Patient Account Number: [id number] Date of Birth/Sex: Treating RN: Jul 26, 1950 (70 y.o. Ardis Rowan, Lauren Primary Care Aryka Coonradt: PA Zenovia Jordan, NO Other Clinician: Referring Jozsef Wescoat: Treating Larue Drawdy/Extender: Skeet Simmer in Treatment: 1 Active Inactive Wound/Skin Impairment Nursing Diagnoses: Impaired tissue integrity Goals: Patient/caregiver will verbalize understanding of skin care regimen Date Initiated: 12/28/2020 Target Resolution Date: 01/25/2021 Goal Status: Active Ulcer/skin breakdown will have a volume reduction  of 30% by week 4 Date Initiated: 12/28/2020 Target Resolution Date: 01/25/2021 Goal Status: Active Interventions: Assess patient/caregiver ability to obtain necessary supplies Assess patient/caregiver ability to perform ulcer/skin care regimen upon admission and as needed Assess ulceration(s) every visit Provide education on ulcer and skin care Treatment Activities: Topical wound management initiated : 12/28/2020 Notes: Electronic Signature(s) Signed: 01/06/2021 5:20:51 PM By: Fonnie Mu RN Entered By: Fonnie Mu on 01/06/2021 08:20:40 -------------------------------------------------------------------------------- Pain Assessment Details Patient Name: Date of Service: Tammy Fitzpatrick, Tammy Fitzpatrick 01/06/2021 8:00 A M Medical Record Number: [id number] Patient Account Number: [id number] Date of Birth/Sex: Treating RN: [birth year] (70 y.o. Ardis Rowan, Lauren Primary Care Krystelle Prashad: PA Zenovia Jordan, NO Other Clinician: Referring Delilah Mulgrew: Treating Kimberlye Dilger/Extender: Skeet Simmer in Treatment: 1 Active Problems Location of Pain Severity  and Description of Pain Patient Has Paino No Site Locations Pain Management and Medication Current Pain Management: Electronic Signature(s) Signed: 01/06/2021 5:20:51 PM By: Fonnie Mu RN Entered By: Fonnie Mu on 01/06/2021 08:03:32 -------------------------------------------------------------------------------- Patient/Caregiver Education Details Patient Name: Date of Service: Tammy Fitzpatrick, Tammy Fitzpatrick 8/17/2022andnbsp8:00 A M Medical Record Number: [id number] Patient Account Number: [id number] Date of Birth/Gender: Treating RN: November 02, 1950 (70 y.o. Ardis Rowan, Lauren Primary Care Physician: PA Zenovia Jordan, NO Other Clinician: Referring Physician: Treating Physician/Extender: Skeet Simmer in Treatment: 1 Education Assessment Education Provided To: Patient Education Topics Provided Wound/Skin Impairment: Methods: Explain/Verbal Responses: State content correctly Electronic Signature(s) Signed: 01/06/2021 5:20:51 PM By: Fonnie Mu RN Entered By: Fonnie Mu on 01/06/2021 08:20:51 -------------------------------------------------------------------------------- Wound Assessment Details Patient Name: Date of Service: Tammy Fitzpatrick, Tammy Fitzpatrick 01/06/2021 8:00 A M Medical Record Number: [id number] Patient Account Number: [id number] Date of Birth/Sex: Treating RN: [birth year] (70 y.o. Ardis Rowan, Lauren Primary Care Jaquawn Saffran: PA TIENT, NO Other Clinician: Referring Rebecah Dangerfield: Treating Tehran Rabenold/Extender: Skeet Simmer in Treatment: 1 Wound Status Wound Number: 1 Primary Etiology: Lymphedema Wound Location: Right, Medial Lower Leg Wound Status: Open Wounding Event: Trauma Comorbid History: Hypertension, Osteoarthritis Date Acquired: 09/21/2020 Weeks Of Treatment: 1 Clustered Wound: No Wound Measurements Length: (cm) 2 Width: (cm) 3.5 Depth: (cm) 0.9 Area: (cm) 5.498 Volume: (cm) 4.948 % Reduction in Area: 37.8% % Reduction in Volume:  -86.6% Epithelialization: Small (1-33%) Tunneling: No Undermining: Yes Starting Position (o'clock): 12 Ending Position (o'clock): 1 Maximum Distance: (cm) 1 Wound Description Classification: Full Thickness Without Exposed Support Structures Wound Margin: Distinct, outline attached Exudate Amount: Medium Exudate Type: Serosanguineous Exudate Color: red, brown Foul Odor After Cleansing: No Slough/Fibrino Yes Wound Bed Granulation Amount: Large (67-100%) Exposed Structure Granulation Quality: Red, Pink Fascia Exposed: No Necrotic Amount: Small (1-33%) Fat Layer (Subcutaneous Tissue) Exposed: Yes Necrotic Quality: Adherent Slough Tendon Exposed: No Muscle Exposed: No Joint Exposed: No Bone Exposed: No Treatment Notes Wound #1 (Lower Leg) Wound Laterality: Right, Medial Cleanser Soap and Water Discharge Instruction: May shower and wash wound with dial antibacterial soap and water prior to dressing change. Wound Cleanser Discharge Instruction: Cleanse the wound with wound cleanser prior to applying a clean dressing using gauze sponges, not tissue or cotton balls. Peri-Wound Care Sween Lotion (Moisturizing lotion) Discharge Instruction: Apply moisturizing lotion as directed Topical Primary Dressing Hydrofera Blue Ready Foam, 2.5 x2.5 in Discharge Instruction: Cut 2 smaller pieces for 2 deeper areas on sides then 1 in middle Secondary Dressing ABD Pad, 5x9 Discharge Instruction: Apply over primary dressing as directed. Secured With Compression Wrap ThreePress (3 layer compression wrap) Discharge Instruction: Apply three layer compression as directed. Compression Stockings Add-Ons Electronic Signature(s) Signed: 01/06/2021 5:20:51 PM By: Fonnie Mu RN Signed:  01/06/2021 5:20:51 PM By: Fonnie Mu RN Entered By: Fonnie Mu on 01/06/2021 08:31:38 -------------------------------------------------------------------------------- Vitals Details Patient  Name: Date of Service: Tammy Fitzpatrick, Tammy Fitzpatrick 01/06/2021 8:00 A M Medical Record Number: [id number] Patient Account Number: [id number] Date of Birth/Sex: Treating RN: 1951/05/22 (70 y.o. Ardis Rowan, Lauren Primary Care Florabelle Cardin: PA TIENT, NO Other Clinician: Referring Tan Clopper: Treating Jahmiya Guidotti/Extender: Skeet Simmer in Treatment: 1 Vital Signs Time Taken: 07:59 Temperature (F): 98.4 Height (in): 69 Pulse (bpm): 100 Weight (lbs): 250 Respiratory Rate (breaths/min): 17 Body Mass Index (BMI): 36.9 Blood Pressure (mmHg): 212/102 Reference Range: 80 - 120 mg / dl Electronic Signature(s) Signed: 01/06/2021 5:20:51 PM By: Fonnie Mu RN Entered By: Fonnie Mu on 01/06/2021 08:03:26

## 2021-01-06 NOTE — Progress Notes (Signed)
Ann MakiBISSELL, Leeasia ([id number]) Visit Report for 01/06/2021 Chief Complaint Document Details Patient Name: Date of Service: Mardee PostinBISSELL, V [place] 01/06/2021 8:00 A M Medical Record Number: [id number] Patient Account Number: [id number] Date of Birth/Sex: Treating RN: April 14, 1951 (70 y.o. F) Primary Care Provider: PA Zenovia JordanIENT, NO Other Clinician: Referring Provider: Treating Provider/Extender: Skeet SimmerStone III, Cesario Weidinger Weeks in Treatment: 1 Information Obtained from: Patient Chief Complaint Right leg ulcer following dog scratch June 2022 Electronic Signature(s) Signed: 01/06/2021 8:22:39 AM By: Lenda KelpStone III, Keldrick Pomplun PA-C Entered By: Lenda KelpStone III, Fawnda Vitullo on 01/06/2021 08:22:38 -------------------------------------------------------------------------------- HPI Details Patient Name: Date of Service: Mardee PostinBISSELL, V [place] 01/06/2021 8:00 A M Medical Record Number: [id number] Patient Account Number: [id number] Date of Birth/Sex: Treating RN: April 14, 1951 (70 y.o. F) Primary Care Provider: PA Zenovia JordanIENT, NO Other Clinician: Referring Provider: Treating Provider/Extender: Skeet SimmerStone III, Rejoice Heatwole Weeks in Treatment: 1 History of Present Illness HPI Description: 12/28/2020 upon evaluation today patient presents for initial evaluation here in our clinic concerning a wound that she has over the right medial lower leg. This is subsequently a result of initially a trauma from her dog where he jumped up on her and caused a laceration. With that being said unfortunately the patient has been having issues with this since that time trying to get this healed. Specifically the initial injury occurred to November 16, 2020. The sutures did seem to be his around 12/07/2020 she was placed on doxycycline. Following that time she has been using Xeroform and a border gauze dressing currently which again is keeping it covered but that is about it. With regard to medical history the patient does have what appears to be an obvious history of chronic venous insufficiency  and lymphedema. She also has high blood pressure noted today.. 01/06/2021 upon evaluation today patient appears to be doing well with regard to her wound. This actually appears to be significantly improved as compared to last week. She has been tolerating the dressing changes. In fact there really does not appear to be anything Require sharp debridement today as she is truly doing so well. She does not necessarily like the compression wrap which to be honest I completely understand but nonetheless I do believe its been beneficial as well as far as getting this wound to heal appropriately. Electronic Signature(s) Signed: 01/06/2021 8:36:40 AM By: Lenda KelpStone III, Shianne Zeiser PA-C Entered By: Lenda KelpStone III, Lazariah Savard on 01/06/2021 08:36:39 -------------------------------------------------------------------------------- Physical Exam Details Patient Name: Date of Service: Mardee PostinBISSELL, V [place] 01/06/2021 8:00 A M Medical Record Number: [id number] Patient Account Number: [id number] Date of Birth/Sex: Treating RN: April 14, 1951 (70 y.o. F) Primary Care Provider: PA TIENT, NO Other Clinician: Referring Provider: Treating Provider/Extender: Skeet SimmerStone III, Kyrielle Urbanski Weeks in Treatment: 1 Constitutional Well-nourished and well-hydrated in no acute distress. Respiratory normal breathing without difficulty. Psychiatric this patient is able to make decisions and demonstrates good insight into disease process. Alert and Oriented x 3. pleasant and cooperative. Notes Upon inspection patient's wound bed showed signs of good granulation epithelization at this point. Fortunately there does not appear to be any signs of infection in fact all the slough is easily wiped off with saline and gauze there really is no need for sharp debridement today at all. I think that the patient seems to be making excellent progress which is also great news. Electronic Signature(s) Signed: 01/06/2021 8:37:02 AM By: Lenda KelpStone III, Timm Bonenberger PA-C Entered By: Lenda KelpStone III, Jaeleigh Monaco  on 01/06/2021 08:37:02 -------------------------------------------------------------------------------- Physician Orders Details Patient Name: Date of Service: Mardee PostinBISSELL, V [place] 01/06/2021 8:00 A M Medical Record Number: [id number] Patient Account  Number: [id number] Date of Birth/Sex: Treating RN: 05-16-1951 (70 y.o. Ardis Rowan, Lauren Primary Care Provider: PA Zenovia Jordan, NO Other Clinician: Referring Provider: Treating Provider/Extender: Skeet Simmer in Treatment: 1 Verbal / Phone Orders: No Diagnosis Coding Follow-up Appointments ppointment in 1 week. - with Leonard Schwartz Return A Bathing/ Shower/ Hygiene May shower with protection but do not get wound dressing(s) wet. Edema Control - Lymphedema / SCD / Other Elevate legs to the level of the heart or above for 30 minutes daily and/or when sitting, a frequency of: Avoid standing for long periods of time. Additional Orders / Instructions Follow Nutritious Diet Wound Treatment Wound #1 - Lower Leg Wound Laterality: Right, Medial Cleanser: Soap and Water 1 x Per Week/15 Days Discharge Instructions: May shower and wash wound with dial antibacterial soap and water prior to dressing change. Cleanser: Wound Cleanser 1 x Per Week/15 Days Discharge Instructions: Cleanse the wound with wound cleanser prior to applying a clean dressing using gauze sponges, not tissue or cotton balls. Peri-Wound Care: Sween Lotion (Moisturizing lotion) 1 x Per Week/15 Days Discharge Instructions: Apply moisturizing lotion as directed Prim Dressing: Hydrofera Blue Ready Foam, 2.5 x2.5 in 1 x Per Week/15 Days ary Discharge Instructions: Cut 2 smaller pieces for 2 deeper areas on sides then 1 in middle Secondary Dressing: ABD Pad, 5x9 1 x Per Week/15 Days Discharge Instructions: Apply over primary dressing as directed. Compression Wrap: ThreePress (3 layer compression wrap) 1 x Per Week/15 Days Discharge Instructions: Apply three layer compression as  directed. Electronic Signature(s) Signed: 01/06/2021 4:56:53 PM By: Lenda Kelp PA-C Signed: 01/06/2021 5:20:51 PM By: Fonnie Mu RN Entered By: Fonnie Mu on 01/06/2021 08:20:31 -------------------------------------------------------------------------------- Problem List Details Patient Name: Date of Service: JADAN, ROUILLARD 01/06/2021 8:00 A M Medical Record Number: [id number] Patient Account Number: [id number] Date of Birth/Sex: Treating RN: Jan 31, 1951 (70 y.o. F) Primary Care Provider: PA Zenovia Jordan, NO Other Clinician: Referring Provider: Treating Provider/Extender: Skeet Simmer in Treatment: 1 Active Problems ICD-10 Encounter Code Description Active Date MDM Diagnosis I87.2 Venous insufficiency (chronic) (peripheral) 12/28/2020 No Yes I89.0 Lymphedema, not elsewhere classified 12/28/2020 No Yes L97.812 Non-pressure chronic ulcer of other part of right lower leg with fat layer 12/28/2020 No Yes exposed I10 Essential (primary) hypertension 12/28/2020 No Yes Inactive Problems Resolved Problems Electronic Signature(s) Signed: 01/06/2021 8:22:10 AM By: Lenda Kelp PA-C Entered By: Lenda Kelp on 01/06/2021 08:22:10 -------------------------------------------------------------------------------- Progress Note Details Patient Name: Date of Service: LAVENE, PENAGOS 01/06/2021 8:00 A M Medical Record Number: [id number] Patient Account Number: [id number] Date of Birth/Sex: Treating RN: 1951-03-03 (70 y.o. F) Primary Care Provider: PA Zenovia Jordan, NO Other Clinician: Referring Provider: Treating Provider/Extender: Skeet Simmer in Treatment: 1 Subjective Chief Complaint Information obtained from Patient Right leg ulcer following dog scratch June 2022 History of Present Illness (HPI) 12/28/2020 upon evaluation today patient presents for initial evaluation here in our clinic concerning a wound that she has over the right medial lower leg. This  is subsequently a result of initially a trauma from her dog where he jumped up on her and caused a laceration. With that being said unfortunately the patient has been having issues with this since that time trying to get this healed. Specifically the initial injury occurred to November 16, 2020. The sutures did seem to be his around 12/07/2020 she was placed on doxycycline. Following that time she has been using Xeroform and a border gauze dressing currently which again is keeping it covered but that is  about it. With regard to medical history the patient does have what appears to be an obvious history of chronic venous insufficiency and lymphedema. She also has high blood pressure noted today.. 01/06/2021 upon evaluation today patient appears to be doing well with regard to her wound. This actually appears to be significantly improved as compared to last week. She has been tolerating the dressing changes. In fact there really does not appear to be anything Require sharp debridement today as she is truly doing so well. She does not necessarily like the compression wrap which to be honest I completely understand but nonetheless I do believe its been beneficial as well as far as getting this wound to heal appropriately. Objective Constitutional Well-nourished and well-hydrated in no acute distress. Vitals Time Taken: 7:59 AM, Height: 69 in, Weight: 250 lbs, BMI: 36.9, Temperature: 98.4 F, Pulse: 100 bpm, Respiratory Rate: 17 breaths/min, Blood Pressure: 212/102 mmHg. Respiratory normal breathing without difficulty. Psychiatric this patient is able to make decisions and demonstrates good insight into disease process. Alert and Oriented x 3. pleasant and cooperative. General Notes: Upon inspection patient's wound bed showed signs of good granulation epithelization at this point. Fortunately there does not appear to be any signs of infection in fact all the slough is easily wiped off with saline and  gauze there really is no need for sharp debridement today at all. I think that the patient seems to be making excellent progress which is also great news. Integumentary (Hair, Skin) Wound #1 status is Open. Original cause of wound was Trauma. The date acquired was: 09/21/2020. The wound has been in treatment 1 weeks. The wound is located on the Right,Medial Lower Leg. The wound measures 2cm length x 3.5cm width x 0.9cm depth; 5.498cm^2 area and 4.948cm^3 volume. There is Fat Layer (Subcutaneous Tissue) exposed. There is no tunneling noted, however, there is undermining starting at 12:00 and ending at 1:00 with a maximum distance of 1cm. There is a medium amount of serosanguineous drainage noted. The wound margin is distinct with the outline attached to the wound base. There is large (67-100%) red, pink granulation within the wound bed. There is a small (1-33%) amount of necrotic tissue within the wound bed including Adherent Slough. Assessment Active Problems ICD-10 Venous insufficiency (chronic) (peripheral) Lymphedema, not elsewhere classified Non-pressure chronic ulcer of other part of right lower leg with fat layer exposed Essential (primary) hypertension Procedures Wound #1 Pre-procedure diagnosis of Wound #1 is a Lymphedema located on the Right,Medial Lower Leg . There was a Three Layer Compression Therapy Procedure by Fonnie Mu, RN. Post procedure Diagnosis Wound #1: Same as Pre-Procedure Plan Follow-up Appointments: Return Appointment in 1 week. - with Luana Shu Shower/ Hygiene: May shower with protection but do not get wound dressing(s) wet. Edema Control - Lymphedema / SCD / Other: Elevate legs to the level of the heart or above for 30 minutes daily and/or when sitting, a frequency of: Avoid standing for long periods of time. Additional Orders / Instructions: Follow Nutritious Diet WOUND #1: - Lower Leg Wound Laterality: Right, Medial Cleanser: Soap and Water 1 x  Per Week/15 Days Discharge Instructions: May shower and wash wound with dial antibacterial soap and water prior to dressing change. Cleanser: Wound Cleanser 1 x Per Week/15 Days Discharge Instructions: Cleanse the wound with wound cleanser prior to applying a clean dressing using gauze sponges, not tissue or cotton balls. Peri-Wound Care: Sween Lotion (Moisturizing lotion) 1 x Per Week/15 Days Discharge Instructions: Apply moisturizing lotion as  directed Prim Dressing: Hydrofera Blue Ready Foam, 2.5 x2.5 in 1 x Per Week/15 Days ary Discharge Instructions: Cut 2 smaller pieces for 2 deeper areas on sides then 1 in middle Secondary Dressing: ABD Pad, 5x9 1 x Per Week/15 Days Discharge Instructions: Apply over primary dressing as directed. Com pression Wrap: ThreePress (3 layer compression wrap) 1 x Per Week/15 Days Discharge Instructions: Apply three layer compression as directed. 1. Would recommend currently that we going to continue with the wound care measures as before and this specifically is in relation [facility] which I think is doing an awesome job although we do have an area of undermining we need to put a small piece of Hydrofera Blue to in order to allow this to healing from the bottom out. 2. Also can recommend that we continue with a 3 layer compression wrap the patient does not love this but nonetheless I think it has been very beneficial and helpful as far as healing is concerned. We will see patient back for reevaluation in 1 week here in the clinic. If anything worsens or changes patient will contact our office for additional recommendations. Electronic Signature(s) Signed: 01/06/2021 8:37:39 AM By: Lenda Kelp PA-C Entered By: Lenda Kelp on 01/06/2021 08:37:39 -------------------------------------------------------------------------------- SuperBill Details Patient Name: Date of Service: DAISHIA, FETTERLY 01/06/2021 Medical Record Number:  [id number] Patient Account Number: [id number] Date of Birth/Sex: Treating RN: 05/19/1951 (70 y.o. Ardis Rowan, Lauren Primary Care Provider: PA TIENT, NO Other Clinician: Referring Provider: Treating Provider/Extender: Skeet Simmer in Treatment: 1 Diagnosis Coding ICD-10 Codes Code Description I87.2 Venous insufficiency (chronic) (peripheral) I89.0 Lymphedema, not elsewhere classified L97.812 Non-pressure chronic ulcer of other part of right lower leg with fat layer exposed I10 Essential (primary) hypertension Facility Procedures CPT4 Code: [id number] Description: (Facility Use Only) [id number] - APPLY MULTLAY COMPRS LWR RT LEG Modifier: Quantity: 1 Physician Procedures : CPT4 Code Description Modifier [id number] - WC PHYS LEVEL 4 - EST PT ICD-10 Diagnosis Description I87.2 Venous insufficiency (chronic) (peripheral) I89.0 Lymphedema, not elsewhere classified L97.812 Non-pressure chronic ulcer of other part of right  lower leg with fat layer exposed I10 Essential (primary) hypertension Quantity: 1 Electronic Signature(s) Signed: 01/06/2021 8:37:53 AM By: Lenda Kelp PA-C Entered By: Lenda Kelp on 01/06/2021 08:37:53

## 2021-01-12 ENCOUNTER — Ambulatory Visit (INDEPENDENT_AMBULATORY_CARE_PROVIDER_SITE_OTHER): Payer: Medicare Other | Admitting: Family

## 2021-01-12 ENCOUNTER — Other Ambulatory Visit: Payer: Self-pay

## 2021-01-12 ENCOUNTER — Ambulatory Visit: Payer: Self-pay | Admitting: Nurse Practitioner

## 2021-01-12 ENCOUNTER — Encounter: Payer: Self-pay | Admitting: Family

## 2021-01-12 VITALS — BP 140/110 | HR 114 | Temp 97.1°F | Resp 18 | Ht 69.0 in | Wt 307.8 lb

## 2021-01-12 DIAGNOSIS — M1990 Unspecified osteoarthritis, unspecified site: Secondary | ICD-10-CM

## 2021-01-12 DIAGNOSIS — Z87891 Personal history of nicotine dependence: Secondary | ICD-10-CM

## 2021-01-12 DIAGNOSIS — Z23 Encounter for immunization: Secondary | ICD-10-CM | POA: Diagnosis not present

## 2021-01-12 DIAGNOSIS — R000 Tachycardia, unspecified: Secondary | ICD-10-CM | POA: Diagnosis not present

## 2021-01-12 DIAGNOSIS — S81801D Unspecified open wound, right lower leg, subsequent encounter: Secondary | ICD-10-CM

## 2021-01-12 DIAGNOSIS — Z1159 Encounter for screening for other viral diseases: Secondary | ICD-10-CM

## 2021-01-12 DIAGNOSIS — Z6841 Body Mass Index (BMI) 40.0 and over, adult: Secondary | ICD-10-CM | POA: Insufficient documentation

## 2021-01-12 DIAGNOSIS — Z1231 Encounter for screening mammogram for malignant neoplasm of breast: Secondary | ICD-10-CM

## 2021-01-12 DIAGNOSIS — I10 Essential (primary) hypertension: Secondary | ICD-10-CM | POA: Diagnosis not present

## 2021-01-12 DIAGNOSIS — Z780 Asymptomatic menopausal state: Secondary | ICD-10-CM

## 2021-01-12 MED ORDER — LOSARTAN POTASSIUM 25 MG PO TABS: 25.0000 mg | ORAL_TABLET | Freq: Every day | ORAL | 0 refills | Status: DC

## 2021-01-12 NOTE — Progress Notes (Addendum)
Provider: Marlowe Sax FNP-C   Kailiana Granquist, Nelda Bucks, NP  Patient Care Team: Lasonja Lakins, Nelda Bucks, NP as PCP - General (Family Medicine)  Extended Emergency Contact Information Primary Emergency Contact: Peaster,frankie Address: [place], [place] [address] Johnnette Litter of [place] Phone: [phone] Work Phone: [phone] Mobile Phone: [phone] Relation: Daughter Secondary Emergency Contact: [place] Phone: [phone] Mobile Phone: [phone] Relation: Son  Code Status:  Full Code  Goals of care: Advanced Directive information Advanced Directives 01/12/2021  Does Patient Have a Medical Advance Directive? No  Would patient like information on creating a medical advance directive? No - Patient declined     Chief Complaint  Patient presents with   Establish Care    New Patient.    HPI:  Pt is a 70 y.o. female seen today to establish care here at the practice for medical management of chronic diseases.Has a medical history of Hypertension not on any medication,Osteoarthritis ,Morbid obesity , former cigarette smoker among others.  Blood pressure and Heart rate is high today.states was told in the past that she had fast heart rate.Never taken any medication.denies any headache,dizziness,vision changes,fatigue,chest tightness,palpitation,chest pain or shortness of breath.    States feels anxious coming in to the office today because she has never been to the doctor's office in many years.   States dog jumped on right leg has deep wound following up with wound center once a week. No drainage.   Feels Fatigue but thinks from depression not being able to go out due to wound.Also works full time from home.Declines any medication for depression.    Has a hx of smoking for 50 years.     Past Medical History:  Diagnosis Date   Arthritis    History of colonoscopy 2020   Hypertension    History reviewed. No pertinent surgical  history.  Allergies  Allergen Reactions   Other Hives and Rash    Poison Ivy    Allergies as of 01/12/2021       Reactions   Other Hives, Rash   Poison Ivy        Medication List        Accurate as of January 12, 2021  2:59 PM. If you have any questions, ask your nurse or doctor.          STOP taking these medications    doxycycline 100 MG capsule Commonly known as: VIBRAMYCIN Stopped by: Sandrea Hughs, NP       TAKE these medications    ADVIL COLD/SINUS PO Take 1 tablet by mouth daily as needed (headache).   multivitamin with minerals Tabs tablet Take 1 tablet by mouth daily.   OPTIFIBER LEAN PO Take by mouth.        Review of Systems  Constitutional:  Negative for appetite change, chills, fatigue, fever and unexpected weight change.  HENT:  Negative for congestion, dental problem, ear discharge, ear pain, facial swelling, hearing loss, nosebleeds, postnasal drip, rhinorrhea, sinus pressure, sinus pain, sneezing, sore throat, tinnitus and trouble swallowing.   Eyes:  Negative for pain, discharge, redness, itching and visual disturbance.  Respiratory:  Negative for cough, chest tightness, shortness of breath and wheezing.   Cardiovascular:  Negative for chest pain, palpitations and leg swelling.  Gastrointestinal:  Negative for abdominal distention, abdominal pain, blood in stool, constipation, diarrhea, nausea and vomiting.  Endocrine: Negative for cold intolerance, heat intolerance, polydipsia, polyphagia and polyuria.  Genitourinary:  Negative for difficulty urinating, dysuria, flank pain, frequency and urgency.  Musculoskeletal:  Negative for arthralgias, back pain, gait problem, joint swelling, myalgias, neck pain and neck stiffness.  Skin:  Negative for color change, pallor, rash and wound.  Neurological:  Negative for dizziness, syncope, speech difficulty, weakness, light-headedness, numbness and headaches.  Hematological:  Does not bruise/bleed  easily.  Psychiatric/Behavioral:  Negative for agitation, behavioral problems, confusion, hallucinations, self-injury, sleep disturbance and suicidal ideas. The patient is not nervous/anxious.    Immunization History  Administered Date(s) Administered   PFIZER(Purple Top)SARS-COV-2 Vaccination 06/15/2019, 07/06/2019, 05/19/2020   Pneumococcal Polysaccharide-23 01/12/2021   Tdap 11/16/2020   Pertinent  Health Maintenance Due  Topic Date Due   COLONOSCOPY (Pts 45-18yr Insurance coverage will need to be confirmed)  Never done   MAMMOGRAM  Never done   DEXA SCAN  Never done   INFLUENZA VACCINE  12/21/2020   PNA vac Low Risk Adult (2 of 2 - PCV13) 01/12/2022   Fall Risk  01/12/2021  Falls in the past year? 0  Number falls in past yr: 0  Injury with Fall? 0  Risk for fall due to : No Fall Risks  Follow up Falls evaluation completed   Functional Status Survey:    Vitals:   01/12/21 1354  BP: (!) 160/120  Pulse: (!) 114  Resp: 18  Temp: (!) 97.1 F (36.2 C)  SpO2: 98%  Weight: (!) 307 lb 12.8 oz (139.6 kg)  Height: 5' 9"  (1.753 m)   Body mass index is 45.45 kg/m. Physical Exam Vitals reviewed.  Constitutional:      General: She is not in acute distress.    Appearance: Normal appearance. She is normal weight. She is not ill-appearing or diaphoretic.  HENT:     Head: Normocephalic.     Right Ear: Tympanic membrane, ear canal and external ear normal. There is no impacted cerumen.     Left Ear: Tympanic membrane, ear canal and external ear normal. There is no impacted cerumen.     Nose: Nose normal. No congestion or rhinorrhea.     Mouth/Throat:     Mouth: Mucous membranes are moist.     Pharynx: Oropharynx is clear. No oropharyngeal exudate or posterior oropharyngeal erythema.  Eyes:     General: No scleral icterus.       Right eye: No discharge.        Left eye: No discharge.     Extraocular Movements: Extraocular movements intact.     Conjunctiva/sclera: Conjunctivae  normal.     Pupils: Pupils are equal, round, and reactive to light.  Neck:     Vascular: No carotid bruit.  Cardiovascular:     Rate and Rhythm: Regular rhythm. Tachycardia present.     Pulses: Normal pulses.     Heart sounds: Normal heart sounds. No murmur heard.   No friction rub. No gallop.     Comments: HR 103 b/min  Pulmonary:     Effort: Pulmonary effort is normal. No respiratory distress.     Breath sounds: Normal breath sounds. No wheezing, rhonchi or rales.  Chest:     Chest wall: No tenderness.  Abdominal:     General: Bowel sounds are normal. There is no distension.     Palpations: Abdomen is soft. There is no mass.     Tenderness: There is no abdominal tenderness. There is no right CVA tenderness, left CVA tenderness, guarding or rebound.  Musculoskeletal:        General: No  swelling or tenderness. Normal range of motion.     Cervical back: Normal range of motion. No rigidity or tenderness.     Right lower leg: No edema.     Left lower leg: No edema.  Lymphadenopathy:     Cervical: No cervical adenopathy.  Skin:    General: Skin is warm and dry.     Coloration: Skin is not pale.     Findings: No bruising, erythema, lesion or rash.     Comments: - knee high dressing wrap dry,clean and intact managed by wound Center.   Neurological:     Mental Status: She is alert and oriented to person, place, and time.     Cranial Nerves: No cranial nerve deficit.     Sensory: No sensory deficit.     Motor: No weakness.     Coordination: Coordination normal.     Gait: Gait normal.  Psychiatric:        Mood and Affect: Mood normal.        Speech: Speech normal.        Behavior: Behavior normal.        Thought Content: Thought content normal.        Judgment: Judgment normal.    Labs reviewed: No results for input(s): NA, K, CL, CO2, GLUCOSE, BUN, CREATININE, CALCIUM, MG, PHOS in the last 8760 hours. No results for input(s): AST, ALT, ALKPHOS, BILITOT, PROT, ALBUMIN in the  last 8760 hours. No results for input(s): WBC, NEUTROABS, HGB, HCT, MCV, PLT in the last 8760 hours. No results found for: TSH No results found for: HGBA1C No results found for: CHOL, HDL, LDLCALC, LDLDIRECT, TRIG, CHOLHDL  Significant Diagnostic Results in last 30 days:  No results found.  Assessment/Plan  1. Uncontrolled hypertension B/p elevated during visit anxious coming to visit too though reports home readings bing high too. Asymptomatic  - will start on losartan as below.SE discussed provided on AVS  - Advised to check Blood pressure at home and record on log and notify provider if B/p > 140/90 bring log in 2 weeks for evaluation. - CBC with Differential/Platelet - CMP with eGFR([facility]) - TSH - Lipid Panel - losartan (COZAAR) 25 MG tablet; Take 1 tablet (25 mg total) by mouth daily.  Dispense: 30 tablet; Refill: 0  2. Tachycardia HR 103 b/min her anxiousness could be a contributory factor  Asymptomatic. Will rule out other metabolic etiologies. - CMP with eGFR([facility]) - TSH - EKG 12-Lead shows sinus Tachycardia within normal limits with HR 103 b/min  - advised to check HR at home when seated calmly and record then f/u in 2 weeks to evaluate. Consider referral to cardiology if Tachycardia persist with normal TSH level.   3. Encounter for hepatitis C screening test for low risk patient No high risk behaviors reported  - Hep C Antibody  4. Breast cancer screening by mammogram Asymptomatic  Made aware imaging center will call her for appointment - MM DIGITAL SCREENING BILATERAL; Future  5. Postmenopausal estrogen deficiency No recent fall or fractures. Aware imaging center will call for appointment.  - DG Bone Density; Future  6. Need for pneumococcal vaccination Afebrile. PNA vac administered by C[facility] today no reaction noted. Advised to take OTC tylenol for any aches or fever - Pneumococcal polysaccharide vaccine 23-valent greater than or equal to 2yo  subcutaneous/IM  7. Arthritis Worst on fingers. Has not required any OTC medication.Just hurts with her knitting.   8. Body mass index (BMI) of 45.0-49.9 in  adult [facility]) Dietary modification advised. Exercise currently limited due to right leg wound from dog scratch.   9. Morbid obesity with BMI of 45.0-49.9, adult ([place]) Dietary modification as above.consider referral to Nutritionist or weight management clinic when right leg wound heals.   10. Open leg wound, right, subsequent encounter Afebrile. Scratched by her dog in June,2022 - knee high dressing wrap dry,clean and intact managed by wound Center.   67. Former cigarette smoker Asymptomatic  Smoked cigarettes for 50 years   Pharmacist, hospital Communication: Reviewed plan of care with patient verbalized understanding.   Labs/tests ordered:  - CBC with Differential/Platelet - CMP with eGFR([facility]) - TSH - Lipid panel - Hep C Antibody  Next Appointment : 2 weeks for blood pressure evaluation.  Sandrea Hughs, NP

## 2021-01-12 NOTE — Patient Instructions (Signed)
Hypertension, Adult High blood pressure (hypertension) is when the force of blood pumping through the arteries is too strong. The arteries are the blood vessels that carry blood from the heart throughout the body. Hypertension forces the heart to work harder to pump blood and may cause arteries to become narrow or stiff. Untreated or uncontrolled hypertension can cause a heart attack, heart failure, a stroke, kidney disease, and otherproblems. A blood pressure reading consists of a higher number over a lower number. Ideally, your blood pressure should be below 120/80. The first ("top") number is called the systolic pressure. It is a measure of the pressure in your arteries as your heart beats. The second ("bottom") number is called the diastolic pressure. It is a measure of the pressure in your arteries as theheart relaxes. What are the causes? The exact cause of this condition is not known. There are some conditions thatresult in or are related to high blood pressure. What increases the risk? Some risk factors for high blood pressure are under your control. The following factors may make you more likely to develop this condition: Smoking. Having type 2 diabetes mellitus, high cholesterol, or both. Not getting enough exercise or physical activity. Being overweight. Having too much fat, sugar, calories, or salt (sodium) in your diet. Drinking too much alcohol. Some risk factors for high blood pressure may be difficult or impossible to change. Some of these factors include: Having chronic kidney disease. Having a family history of high blood pressure. Age. Risk increases with age. Race. You may be at higher risk if you are African American. Gender. Men are at higher risk than women before age 45. After age 65, women are at higher risk than men. Having obstructive sleep apnea. Stress. What are the signs or symptoms? High blood pressure may not cause symptoms. Very high blood pressure (hypertensive  crisis) may cause: Headache. Anxiety. Shortness of breath. Nosebleed. Nausea and vomiting. Vision changes. Severe chest pain. Seizures. How is this diagnosed? This condition is diagnosed by measuring your blood pressure while you are seated, with your arm resting on a flat surface, your legs uncrossed, and your feet flat on the floor. The cuff of the blood pressure monitor will be placed directly against the skin of your upper arm at the level of your heart. It should be measured at least twice using the same arm. Certain conditions cancause a difference in blood pressure between your right and left arms. Certain factors can cause blood pressure readings to be lower or higher than normal for a short period of time: When your blood pressure is higher when you are in a health care provider's office than when you are at home, this is called white coat hypertension. Most people with this condition do not need medicines. When your blood pressure is higher at home than when you are in a health care provider's office, this is called masked hypertension. Most people with this condition may need medicines to control blood pressure. If you have a high blood pressure reading during one visit or you have normal blood pressure with other risk factors, you may be asked to: Return on a different day to have your blood pressure checked again. Monitor your blood pressure at home for 1 week or longer. If you are diagnosed with hypertension, you may have other blood or imaging tests to help your health care provider understand your overall risk for otherconditions. How is this treated? This condition is treated by making healthy lifestyle changes, such as   eating healthy foods, exercising more, and reducing your alcohol intake. Your health care provider may prescribe medicine if lifestyle changes are not enough to get your blood pressure under control, and if: Your systolic blood pressure is above 130. Your  diastolic blood pressure is above 80. Your personal target blood pressure may vary depending on your medicalconditions, your age, and other factors. Follow these instructions at home: Eating and drinking  Eat a diet that is high in fiber and potassium, and low in sodium, added sugar, and fat. An example eating plan is called the DASH (Dietary Approaches to Stop Hypertension) diet. To eat this way: Eat plenty of fresh fruits and vegetables. Try to fill one half of your plate at each meal with fruits and vegetables. Eat whole grains, such as whole-wheat pasta, brown rice, or whole-grain bread. Fill about one fourth of your plate with whole grains. Eat or drink low-fat dairy products, such as skim milk or low-fat yogurt. Avoid fatty cuts of meat, processed or cured meats, and poultry with skin. Fill about one fourth of your plate with lean proteins, such as fish, chicken without skin, beans, eggs, or tofu. Avoid pre-made and processed foods. These tend to be higher in sodium, added sugar, and fat. Reduce your daily sodium intake. Most people with hypertension should eat less than 1,500 mg of sodium a day. Do not drink alcohol if: Your health care provider tells you not to drink. You are pregnant, may be pregnant, or are planning to become pregnant. If you drink alcohol: Limit how much you use to: 0-1 drink a day for women. 0-2 drinks a day for men. Be aware of how much alcohol is in your drink. In the [place] one drink equals one 12 oz bottle of beer (355 mL), one 5 oz glass of wine (148 mL), or one 1 oz glass of hard liquor (44 mL).  Lifestyle  Work with your health care provider to maintain a healthy body weight or to lose weight. Ask what an ideal weight is for you. Get at least 30 minutes of exercise most days of the week. Activities may include walking, swimming, or biking. Include exercise to strengthen your muscles (resistance exercise), such as Pilates or lifting weights, as part of your  weekly exercise routine. Try to do these types of exercises for 30 minutes at least 3 days a week. Do not use any products that contain nicotine or tobacco, such as cigarettes, e-cigarettes, and chewing tobacco. If you need help quitting, ask your health care provider. Monitor your blood pressure at home as told by your health care provider. Keep all follow-up visits as told by your health care provider. This is important.  Medicines Take over-the-counter and prescription medicines only as told by your health care provider. Follow directions carefully. Blood pressure medicines must be taken as prescribed. Do not skip doses of blood pressure medicine. Doing this puts you at risk for problems and can make the medicine less effective. Ask your health care provider about side effects or reactions to medicines that you should watch for. Contact a health care provider if you: Think you are having a reaction to a medicine you are taking. Have headaches that keep coming back (recurring). Feel dizzy. Have swelling in your ankles. Have trouble with your vision. Get help right away if you: Develop a severe headache or confusion. Have unusual weakness or numbness. Feel faint. Have severe pain in your chest or abdomen. Vomit repeatedly. Have trouble breathing. Summary Hypertension is   when the force of blood pumping through your arteries is too strong. If this condition is not controlled, it may put you at risk for serious complications. Your personal target blood pressure may vary depending on your medical conditions, your age, and other factors. For most people, a normal blood pressure is less than 120/80. Hypertension is treated with lifestyle changes, medicines, or a combination of both. Lifestyle changes include losing weight, eating a healthy, low-sodium diet, exercising more, and limiting alcohol. This information is not intended to replace advice given to you by your health care provider. Make  sure you discuss any questions you have with your healthcare provider. Document Revised: 01/17/2018 Document Reviewed: 01/17/2018 Elsevier Patient Education  2022 Elsevier Inc.  Losartan Tablets What is this medication? LOSARTAN (loe SAR tan) treats high blood pressure. It may also be used to prevent a stroke in people with heart disease and high blood pressure. It can be used to prevent kidney damage in people with diabetes. It works by relaxing the blood vessels, which helps decrease the amount of work your heart has todo. It belongs to a group of medications called ARBs. This medicine may be used for other purposes; ask your health care provider orpharmacist if you have questions. COMMON BRAND NAME(S): Cozaar What should I tell my care team before I take this medication? They need to know if you have any of these conditions: Heart failure Kidney disease Liver disease An unusual or allergic reaction to losartan, other medications, foods, dyes, or preservatives Pregnant or trying to get pregnant Breast-feeding How should I use this medication? Take this medication by mouth. Take it as directed on the prescription label at the same time every day. You can take it with or without food. If it upsets your stomach, take it with food. Keep taking it unless your care team tells youto stop. Talk to your care team about the use of this medication in children. While it may be prescribed for children as young as 6 for selected conditions,precautions do apply. Overdosage: If you think you have taken too much of this medicine contact apoison control center or emergency room at once. NOTE: This medicine is only for you. Do not share this medicine with others. What if I miss a dose? If you miss a dose, take it as soon as you can. If it is almost time for yournext dose, take only that dose. Do not take double or extra doses. What may interact with this medication? Aliskiren ACE inhibitors, like enalapril  or lisinopril Diuretics, especially amiloride, eplerenone, spironolactone, or triamterene Lithium NSAIDs, medications for pain and inflammation, like ibuprofen or naproxen Potassium salts or potassium supplements This list may not describe all possible interactions. Give your health care provider a list of all the medicines, herbs, non-prescription drugs, or dietary supplements you use. Also tell them if you smoke, drink alcohol, or use illegaldrugs. Some items may interact with your medicine. What should I watch for while using this medication? Visit your care team for regular check ups. Check your blood pressure as directed. Ask your care team what your blood pressure should be. Also, find outwhen you should contact them. Do not treat yourself for coughs, colds, or pain while you are using this medication without asking your care team for advice. Some medications mayincrease your blood pressure. Women should inform their care team if they wish to become pregnant or think they might be pregnant. There is a potential for serious side effects to anunborn child. Talk   to your care team for more information. You may get drowsy or dizzy. Do not drive, use machinery, or do anything that needs mental alertness until you know how this medication affects you. Do not stand or sit up quickly, especially if you are an older patient. This reduces the risk of dizzy or fainting spells. Alcohol can make you more drowsy anddizzy. Avoid alcoholic drinks. Avoid salt substitutes unless you are told otherwise by your care team. What side effects may I notice from receiving this medication? Side effects that you should report to your care team as soon as possible: Allergic reactions-skin rash, itching, hives, swelling of the face, lips, tongue, or throat High potassium level-muscle weakness, fast or irregular heartbeat Kidney injury-decrease in the amount of urine, swelling of the ankles, hands, or feet Low blood  pressure-dizziness, feeling faint or lightheaded, blurry vision Side effects that usually do not require medical attention (report to your careteam if they continue or are bothersome): Dizziness Headache Runny or stuffy nose This list may not describe all possible side effects. Call your doctor for medical advice about side effects. You may report side effects to FDA at1-800-FDA-1088. Where should I keep my medication? Keep out of the reach of children and pets. Store at room temperature between 20 and 25 degrees C (68 and 77 degrees F). Protect from light. Keep the container tightly closed. Get rid of any unusedmedication after the expiration date. To get rid of medications that are no longer needed or have expired: Take the medication to a medication take-back program. Check with your pharmacy or law enforcement to find a location. If you cannot return the medication, check the label or package insert to see if the medication should be thrown out in the garbage or flushed down the toilet. If you are not sure, ask your care team. If it is safe to put in the trash, empty the medication out of the container. Mix the medication with cat litter, dirt, coffee grounds, or other unwanted substance. Seal the mixture in a bag or container. Put it in the trash. NOTE: This sheet is a summary. It may not cover all possible information. If you have questions about this medicine, talk to your doctor, pharmacist, orhealth care provider.  2022 Elsevier/Gold Standard (2020-04-01 13:49:17)   

## 2021-01-13 ENCOUNTER — Other Ambulatory Visit: Payer: Self-pay

## 2021-01-13 ENCOUNTER — Other Ambulatory Visit: Payer: Self-pay | Admitting: Family

## 2021-01-13 ENCOUNTER — Telehealth: Payer: Self-pay

## 2021-01-13 ENCOUNTER — Encounter (HOSPITAL_BASED_OUTPATIENT_CLINIC_OR_DEPARTMENT_OTHER): Payer: Medicare Other | Admitting: Physician Assistant

## 2021-01-13 DIAGNOSIS — L97812 Non-pressure chronic ulcer of other part of right lower leg with fat layer exposed: Secondary | ICD-10-CM | POA: Diagnosis not present

## 2021-01-13 DIAGNOSIS — E7800 Pure hypercholesterolemia, unspecified: Secondary | ICD-10-CM

## 2021-01-13 DIAGNOSIS — I10 Essential (primary) hypertension: Secondary | ICD-10-CM

## 2021-01-13 NOTE — Telephone Encounter (Signed)
This encounter was opened to document where to locate providers comments to labs completed on 01/12/2021.  [name], NP attached comments to labs on the EKG done on 01/12/2021  Go to the EKG tab to view

## 2021-01-13 NOTE — Progress Notes (Addendum)
Tammy Fitzpatrick, Tammy Fitzpatrick ([id number]) Visit Report for 01/13/2021 Chief Complaint Document Details Patient Name: Date of Service: Tammy Fitzpatrick, Tammy Fitzpatrick 01/13/2021 8:00 A M Medical Record Number: [id number] Patient Account Number: [id number] Date of Birth/Sex: Treating RN: [birth year] (70 y.o. [name] Primary Care Provider: [name] Other Clinician: Referring Provider: Treating Provider/Extender: Skeet Simmer in Treatment: 2 Information Obtained from: Patient Chief Complaint Right leg ulcer following dog scratch June 2022 Electronic Signature(s) Signed: 01/13/2021 8:27:45 AM By: Lenda Kelp PA-C Entered By: Lenda Kelp on 01/13/2021 08:27:45 -------------------------------------------------------------------------------- HPI Details Patient Name: Date of Service: Tammy Fitzpatrick, Tammy Fitzpatrick 01/13/2021 8:00 A M Medical Record Number: [id number] Patient Account Number: [id number] Date of Birth/Sex: Treating RN: June 06, 1950 (70 y.o. [name] Primary Care Provider: [name] Other Clinician: Referring Provider: Treating Provider/Extender: Skeet Simmer in Treatment: 2 History of Present Illness HPI Description: 12/28/2020 upon evaluation today patient presents for initial evaluation here in our clinic concerning a wound that she has over the right medial lower leg. This is subsequently a result of initially a trauma from her dog where he jumped up on her and caused a laceration. With that being said unfortunately the patient has been having issues with this since that time trying to get this healed. Specifically the initial injury occurred to November 16, 2020. The sutures did seem to be his around 12/07/2020 she was placed on doxycycline. Following that time she has been using Xeroform and a border gauze dressing currently which again is keeping it covered but that is about it. With regard to medical history the patient does have what appears to be an obvious  history of chronic venous insufficiency and lymphedema. She also has high blood pressure noted today.. 01/06/2021 upon evaluation today patient appears to be doing well with regard to her wound. This actually appears to be significantly improved as compared to last week. She has been tolerating the dressing changes. In fact there really does not appear to be anything Require sharp debridement today as she is truly doing so well. She does not necessarily like the compression wrap which to be honest I completely understand but nonetheless I do believe its been beneficial as well as far as getting this wound to heal appropriately. 01/13/2021 upon evaluation today patient appears to be doing well with regard to her leg ulcer. She has been tolerating the dressing changes without complication. Hydrofera Blue was done excellent job up to this point. With that being said I do not believe that we are seeing any signs of worsening and in fact everything appears to be significantly improved compared to previous. Electronic Signature(s) Signed: 01/13/2021 2:34:59 PM By: Lenda Kelp PA-C Entered By: Lenda Kelp on 01/13/2021 14:34:59 -------------------------------------------------------------------------------- Physical Exam Details Patient Name: Date of Service: Tammy Fitzpatrick, Tammy Fitzpatrick 01/13/2021 8:00 A M Medical Record Number: [id number] Patient Account Number: [id number] Date of Birth/Sex: Treating RN: Mar 11, 1951 (70 y.o. [name] Primary Care Provider: [name] Other Clinician: Referring Provider: Treating Provider/Extender: Skeet Simmer in Treatment: 2 Constitutional Well-nourished and well-hydrated in no acute distress. Respiratory normal breathing without difficulty. Psychiatric this patient is able to make decisions and demonstrates good insight into disease process. Alert and Oriented x 3. pleasant and cooperative. Notes Patient shows no signs of infection and  overall this seems to be making excellent progress at this point which is great news I am extremely pleased with where we stand today. No fevers, chills, nausea, vomiting, or diarrhea. Electronic Signature(s)  Signed: 01/13/2021 2:35:18 PM By: Lenda Kelp PA-C Entered By: Lenda Kelp on 01/13/2021 14:35:18 -------------------------------------------------------------------------------- Physician Orders Details Patient Name: Date of Service: Tammy Fitzpatrick, Tammy Fitzpatrick 01/13/2021 8:00 A M Medical Record Number: [id number] Patient Account Number: [id number] Date of Birth/Sex: Treating RN: [birth year] (70 y.o. Roel Cluck Primary Care Provider: [name] Other Clinician: Referring Provider: Treating Provider/Extender: Skeet Simmer in Treatment: 2 Verbal / Phone Orders: No Diagnosis Coding ICD-10 Coding Code Description I87.2 Venous insufficiency (chronic) (peripheral) I89.0 Lymphedema, not elsewhere classified L97.812 Non-pressure chronic ulcer of other part of right lower leg with fat layer exposed I10 Essential (primary) hypertension Follow-up Appointments ppointment in 1 week. - with Leonard Schwartz Return A Bathing/ Shower/ Hygiene May shower with protection but do not get wound dressing(s) wet. Edema Control - Lymphedema / SCD / Other Elevate legs to the level of the heart or above for 30 minutes daily and/or when sitting, a frequency of: Avoid standing for long periods of time. Additional Orders / Instructions Follow Nutritious Diet Wound Treatment Wound #1 - Lower Leg Wound Laterality: Right, Medial Cleanser: Soap and Water 1 x Per Week/15 Days Discharge Instructions: May shower and wash wound with dial antibacterial soap and water prior to dressing change. Cleanser: Wound Cleanser 1 x Per Week/15 Days Discharge Instructions: Cleanse the wound with wound cleanser prior to applying a clean dressing using gauze sponges, not tissue or cotton balls. Peri-Wound Care: Sween  Lotion (Moisturizing lotion) 1 x Per Week/15 Days Discharge Instructions: Apply moisturizing lotion as directed Prim Dressing: Hydrofera Blue Ready Foam, 2.5 x2.5 in 1 x Per Week/15 Days ary Discharge Instructions: Cut 2 smaller pieces for 2 deeper areas on sides then 1 in middle Secondary Dressing: ABD Pad, 5x9 1 x Per Week/15 Days Discharge Instructions: Apply over primary dressing as directed. Compression Wrap: ThreePress (3 layer compression wrap) 1 x Per Week/15 Days Discharge Instructions: Apply three layer compression as directed. Electronic Signature(s) Signed: 01/13/2021 5:22:48 PM By: Antonieta Iba Signed: 01/13/2021 6:37:42 PM By: Lenda Kelp PA-C Entered By: Antonieta Iba on 01/13/2021 08:40:58 -------------------------------------------------------------------------------- Problem List Details Patient Name: Date of Service: Tammy Fitzpatrick, Tammy Fitzpatrick 01/13/2021 8:00 A M Medical Record Number: [id number] Patient Account Number: [id number] Date of Birth/Sex: Treating RN: 05-30-1950 (70 y.o. Roel Cluck Primary Care Provider: [name] Other Clinician: Referring Provider: Treating Provider/Extender: Skeet Simmer in Treatment: 2 Active Problems ICD-10 Encounter Code Description Active Date MDM Diagnosis I87.2 Venous insufficiency (chronic) (peripheral) 12/28/2020 No Yes I89.0 Lymphedema, not elsewhere classified 12/28/2020 No Yes L97.812 Non-pressure chronic ulcer of other part of right lower leg with fat layer 12/28/2020 No Yes exposed I10 Essential (primary) hypertension 12/28/2020 No Yes Inactive Problems Resolved Problems Electronic Signature(s) Signed: 01/13/2021 8:27:39 AM By: Lenda Kelp PA-C Entered By: Lenda Kelp on 01/13/2021 08:27:39 -------------------------------------------------------------------------------- Progress Note Details Patient Name: Date of Service: Tammy Fitzpatrick, Tammy Fitzpatrick 01/13/2021 8:00 A M Medical Record Number:  [id number] Patient Account Number: [id number] Date of Birth/Sex: Treating RN: 03-22-1951 (70 y.o. [name] Primary Care Provider: [name] Other Clinician: Referring Provider: Treating Provider/Extender: Skeet Simmer in Treatment: 2 Subjective Chief Complaint Information obtained from Patient Right leg ulcer following dog scratch June 2022 History of Present Illness (HPI) 12/28/2020 upon evaluation today patient presents for initial evaluation here in our clinic concerning a wound that she has over the right medial lower leg. This is subsequently a result of initially a trauma from her dog where he jumped up on  her and caused a laceration. With that being said unfortunately the patient has been having issues with this since that time trying to get this healed. Specifically the initial injury occurred to November 16, 2020. The sutures did seem to be his around 12/07/2020 she was placed on doxycycline. Following that time she has been using Xeroform and a border gauze dressing currently which again is keeping it covered but that is about it. With regard to medical history the patient does have what appears to be an obvious history of chronic venous insufficiency and lymphedema. She also has high blood pressure noted today.. 01/06/2021 upon evaluation today patient appears to be doing well with regard to her wound. This actually appears to be significantly improved as compared to last week. She has been tolerating the dressing changes. In fact there really does not appear to be anything Require sharp debridement today as she is truly doing so well. She does not necessarily like the compression wrap which to be honest I completely understand but nonetheless I do believe its been beneficial as well as far as getting this wound to heal appropriately. 01/13/2021 upon evaluation today patient appears to be doing well with regard to her leg ulcer. She has been tolerating the dressing  changes without complication. Hydrofera Blue was done excellent job up to this point. With that being said I do not believe that we are seeing any signs of worsening and in fact everything appears to be significantly improved compared to previous. Objective Constitutional Well-nourished and well-hydrated in no acute distress. Vitals Time Taken: 8:15 AM, Height: 69 in, Source: Stated, Weight: 250 lbs, Source: Stated, BMI: 36.9, Temperature: 98.3 F, Pulse: 109 bpm, Respiratory Rate: 16 breaths/min, Blood Pressure: 176/98 mmHg. Respiratory normal breathing without difficulty. Psychiatric this patient is able to make decisions and demonstrates good insight into disease process. Alert and Oriented x 3. pleasant and cooperative. General Notes: Patient shows no signs of infection and overall this seems to be making excellent progress at this point which is great news I am extremely pleased with where we stand today. No fevers, chills, nausea, vomiting, or diarrhea. Integumentary (Hair, Skin) Wound #1 status is Open. Original cause of wound was Trauma. The date acquired was: 09/21/2020. The wound has been in treatment 2 weeks. The wound is located on the Right,Medial Lower Leg. The wound measures 1.5cm length x 3.7cm width x 0.9cm depth; 4.359cm^2 area and 3.923cm^3 volume. There is Fat Layer (Subcutaneous Tissue) exposed. There is no undermining noted, however, there is tunneling at 12:00 with a maximum distance of 1.7cm. There is a medium amount of serosanguineous drainage noted. The wound margin is distinct with the outline attached to the wound base. There is large (67-100%) red granulation within the wound bed. There is no necrotic tissue within the wound bed. Assessment Active Problems ICD-10 Venous insufficiency (chronic) (peripheral) Lymphedema, not elsewhere classified Non-pressure chronic ulcer of other part of right lower leg with fat layer exposed Essential (primary)  hypertension Procedures Wound #1 Pre-procedure diagnosis of Wound #1 is a Lymphedema located on the Right,Medial Lower Leg . There was a Three Layer Compression Therapy Procedure by Antonieta IbaBarnhart, Jodi, RN. Post procedure Diagnosis Wound #1: Same as Pre-Procedure Plan Follow-up Appointments: Return Appointment in 1 week. - with Luana ShuHoyt Bathing/ Shower/ Hygiene: May shower with protection but do not get wound dressing(s) wet. Edema Control - Lymphedema / SCD / Other: Elevate legs to the level of the heart or above for 30 minutes daily and/or when sitting,  a frequency of: Avoid standing for long periods of time. Additional Orders / Instructions: Follow Nutritious Diet WOUND #1: - Lower Leg Wound Laterality: Right, Medial Cleanser: Soap and Water 1 x Per Week/15 Days Discharge Instructions: May shower and wash wound with dial antibacterial soap and water prior to dressing change. Cleanser: Wound Cleanser 1 x Per Week/15 Days Discharge Instructions: Cleanse the wound with wound cleanser prior to applying a clean dressing using gauze sponges, not tissue or cotton balls. Peri-Wound Care: Sween Lotion (Moisturizing lotion) 1 x Per Week/15 Days Discharge Instructions: Apply moisturizing lotion as directed Prim Dressing: Hydrofera Blue Ready Foam, 2.5 x2.5 in 1 x Per Week/15 Days ary Discharge Instructions: Cut 2 smaller pieces for 2 deeper areas on sides then 1 in middle Secondary Dressing: ABD Pad, 5x9 1 x Per Week/15 Days Discharge Instructions: Apply over primary dressing as directed. Com pression Wrap: ThreePress (3 layer compression wrap) 1 x Per Week/15 Days Discharge Instructions: Apply three layer compression as directed. 1. Would recommend currently that we going continue with the wound care measures as before and the patient is in agreement with plan. This includes the use of the Hydrofera Blue tucked into the small tunnel area and then applied topically to the wound bed. 2. Also can  recommend that we continue with the 3 layer compression wrap which is doing a great job at this point. 3. I am also can recommend the patient should continue to elevate her legs as much as possible. We will see patient back for reevaluation in 1 week here in the clinic. If anything worsens or changes patient will contact our office for additional recommendations. Electronic Signature(s) Signed: 01/13/2021 2:35:48 PM By: Lenda Kelp PA-C Entered By: Lenda Kelp on 01/13/2021 14:35:48 -------------------------------------------------------------------------------- SuperBill Details Patient Name: Date of Service: Tammy Fitzpatrick, Tammy Fitzpatrick 01/13/2021 Medical Record Number: [id number] Patient Account Number: [id number] Date of Birth/Sex: Treating RN: 24-Sep-1950 (70 y.o. Roel Cluck Primary Care Provider: [name] Other Clinician: Referring Provider: Treating Provider/Extender: Skeet Simmer in Treatment: 2 Diagnosis Coding ICD-10 Codes Code Description I87.2 Venous insufficiency (chronic) (peripheral) I89.0 Lymphedema, not elsewhere classified L97.812 Non-pressure chronic ulcer of other part of right lower leg with fat layer exposed I10 Essential (primary) hypertension Facility Procedures CPT4 Code: [id number] Description: (Facility Use Only) [phone] - APPLY MULTLAY COMPRS LWR RT LEG ICD-10 Diagnosis Description L97.812 Non-pressure chronic ulcer of other part of right lower leg with fat layer exposed I89.0 Lymphedema, not elsewhere classified Modifier: Quantity: 1 Physician Procedures : CPT4 Code Description Modifier [id number] - WC PHYS LEVEL 4 - EST PT ICD-10 Diagnosis Description I87.2 Venous insufficiency (chronic) (peripheral) I89.0 Lymphedema, not elsewhere classified L97.812 Non-pressure chronic ulcer of other part of right  lower leg with fat layer exposed I10 Essential (primary) hypertension Quantity: 1 Electronic Signature(s) Signed: 01/13/2021 2:36:07 PM By:  Lenda Kelp PA-C Entered By: Lenda Kelp on 01/13/2021 14:36:07

## 2021-01-13 NOTE — Progress Notes (Signed)
Tammy Fitzpatrick, Tammy Fitzpatrick ([id number]) Visit Report for 01/13/2021 Arrival Information Details Patient Name: Date of Service: Tammy Fitzpatrick, Tammy Fitzpatrick 01/13/2021 8:00 A M Medical Record Number: [id number] Patient Account Number: [id number] Date of Birth/Sex: Treating RN: [birth year] (70 y.o. Roel Cluck Primary Care Barrie Wale: [name] Other Clinician: Referring Ibrohim Simmers: Treating Estefania Kamiya/Extender: Skeet Simmer in Treatment: 2 Visit Information History Since Last Visit Added or deleted any medications: No Patient Arrived: Ambulatory Any new allergies or adverse reactions: No Arrival Time: 08:15 Had a fall or experienced change in No Transfer Assistance: None activities of daily living that may affect Patient Identification Verified: Yes risk of falls: Secondary Verification Process Completed: Yes Signs or symptoms of abuse/neglect since last visito No Patient Requires Transmission-Based Precautions: No Hospitalized since last visit: No Patient Has Alerts: No Implantable device outside of the clinic excluding No cellular tissue based products placed in the center since last visit: Has Dressing in Place as Prescribed: Yes Has Compression in Place as Prescribed: Yes Pain Present Now: No Electronic Signature(s) Signed: 01/13/2021 5:22:48 PM By: Antonieta Iba Entered By: Antonieta Iba on 01/13/2021 08:15:28 -------------------------------------------------------------------------------- Compression Therapy Details Patient Name: Date of Service: Tammy Fitzpatrick, Tammy Fitzpatrick 01/13/2021 8:00 A M Medical Record Number: [id number] Patient Account Number: [id number] Date of Birth/Sex: Treating RN: 1951/04/09 (70 y.o. Roel Cluck Primary Care Alianna Wurster: [name] Other Clinician: Referring Dallyn Bergland: Treating Meaghen Vecchiarelli/Extender: Skeet Simmer in Treatment: 2 Compression Therapy Performed for Wound Assessment: Wound #1 Right,Medial Lower Leg Performed By: Clinician Antonieta Iba, RN Compression Type: Three Layer Post Procedure Diagnosis Same as Pre-procedure Electronic Signature(s) Signed: 01/13/2021 5:22:48 PM By: Antonieta Iba Entered By: Antonieta Iba on 01/13/2021 08:41:26 -------------------------------------------------------------------------------- Encounter Discharge Information Details Patient Name: Date of Service: Tammy Fitzpatrick, Tammy Fitzpatrick 01/13/2021 8:00 A M Medical Record Number: [id number] Patient Account Number: [id number] Date of Birth/Sex: Treating RN: 1950-07-27 (70 y.o. Roel Cluck Primary Care Jeanell Mangan: [name] Other Clinician: Referring Kaveon Blatz: Treating Alphonso Gregson/Extender: Skeet Simmer in Treatment: 2 Encounter Discharge Information Items Discharge Condition: Stable Ambulatory Status: Ambulatory Discharge Destination: Home Transportation: Private Auto Schedule Follow-up Appointment: Yes Clinical Summary of Care: Provided on 01/13/2021 Form Type Recipient Paper Patient Patient Electronic Signature(s) Signed: 01/13/2021 5:22:48 PM By: Antonieta Iba Entered By: Antonieta Iba on 01/13/2021 08:42:47 -------------------------------------------------------------------------------- Lower Extremity Assessment Details Patient Name: Date of Service: Tammy Fitzpatrick, Tammy Fitzpatrick 01/13/2021 8:00 A M Medical Record Number: [id number] Patient Account Number: [id number] Date of Birth/Sex: Treating RN: May 08, 1951 (70 y.o. Roel Cluck Primary Care Kevon Tench: [name] Other Clinician: Referring Latrail Pounders: Treating Mertha Clyatt/Extender: Skeet Simmer in Treatment: 2 Edema Assessment Assessed: [Left: No] [Right: No] Edema: [Left: Ye] [Right: s] Calf Left: Right: Point of Measurement: 29 cm From Medial Instep 50 cm Ankle Left: Right: Point of Measurement: 9 cm From Medial Instep 31.5 cm Vascular Assessment Pulses: Dorsalis Pedis Palpable: [Right:Yes] Electronic Signature(s) Signed: 01/13/2021 5:22:48 PM By:  Antonieta Iba Entered By: Antonieta Iba on 01/13/2021 08:20:22 -------------------------------------------------------------------------------- Multi-Disciplinary Care Plan Details Patient Name: Date of Service: Tammy Fitzpatrick, Tammy Fitzpatrick 01/13/2021 8:00 A M Medical Record Number: [id number] Patient Account Number: [id number] Date of Birth/Sex: Treating RN: 1950-07-07 (70 y.o. Roel Cluck Primary Care Ivette Castronova: [name] Other Clinician: Referring Maxtyn Nuzum: Treating Yehudah Standing/Extender: Skeet Simmer in Treatment: 2 Active Inactive Wound/Skin Impairment Nursing Diagnoses: Impaired tissue integrity Goals: Patient/caregiver will verbalize understanding of skin care regimen Date Initiated: 12/28/2020 Target Resolution Date: 01/25/2021 Goal Status: Active Ulcer/skin breakdown will have a volume reduction of 30% by week 4  Date Initiated: 12/28/2020 Target Resolution Date: 01/25/2021 Goal Status: Active Interventions: Assess patient/caregiver ability to obtain necessary supplies Assess patient/caregiver ability to perform ulcer/skin care regimen upon admission and as needed Assess ulceration(s) every visit Provide education on ulcer and skin care Treatment Activities: Topical wound management initiated : 12/28/2020 Notes: Electronic Signature(s) Signed: 01/13/2021 5:22:48 PM By: Antonieta Iba Entered By: Antonieta Iba on 01/13/2021 08:12:38 -------------------------------------------------------------------------------- Pain Assessment Details Patient Name: Date of Service: Tammy Fitzpatrick, Tammy Fitzpatrick 01/13/2021 8:00 A M Medical Record Number: [id number] Patient Account Number: [id number] Date of Birth/Sex: Treating RN: March 28, 1951 (70 y.o. Roel Cluck Primary Care Canyon Lohr: [name] Other Clinician: Referring Rudolpho Claxton: Treating Quintell Bonnin/Extender: Skeet Simmer in Treatment: 2 Active Problems Location of Pain Severity and Description of Pain Patient Has Paino  No Site Locations Rate the pain. Rate the pain. Current Pain Level: 0 Pain Management and Medication Current Pain Management: Electronic Signature(s) Signed: 01/13/2021 5:22:48 PM By: Antonieta Iba Entered By: Antonieta Iba on 01/13/2021 08:19:07 -------------------------------------------------------------------------------- Patient/Caregiver Education Details Patient Name: Date of Service: Tammy Fitzpatrick, Tammy Fitzpatrick 8/24/2022andnbsp8:00 A M Medical Record Number: [id number] Patient Account Number: [id number] Date of Birth/Gender: Treating RN: 1950-07-20 (70 y.o. Roel Cluck Primary Care Physician: [name] Other Clinician: Referring Physician: Treating Physician/Extender: Skeet Simmer in Treatment: 2 Education Assessment Education Provided To: Patient Education Topics Provided Venous: Methods: Explain/Verbal, Printed Responses: State content correctly Wound/Skin Impairment: Methods: Demonstration, Explain/Verbal, Printed Responses: State content correctly Electronic Signature(s) Signed: 01/13/2021 5:22:48 PM By: Antonieta Iba Entered By: Antonieta Iba on 01/13/2021 08:13:09 -------------------------------------------------------------------------------- Wound Assessment Details Patient Name: Date of Service: Tammy Fitzpatrick, Tammy Fitzpatrick 01/13/2021 8:00 A M Medical Record Number: [id number] Patient Account Number: [id number] Date of Birth/Sex: Treating RN: April 30, 1951 (70 y.o. [name] Primary Care Harlis Champoux: [name] Other Clinician: Referring Elester Apodaca: Treating Markice Torbert/Extender: Skeet Simmer in Treatment: 2 Wound Status Wound Number: 1 Primary Etiology: Lymphedema Wound Location: Right, Medial Lower Leg Wound Status: Open Wounding Event: Trauma Comorbid History: Hypertension, Osteoarthritis Date Acquired: 09/21/2020 Weeks Of Treatment: 2 Clustered Wound: No Photos Wound Measurements Length: (cm) 1.5 Width: (cm) 3.7 Depth: (cm)  0.9 Area: (cm) 4.359 Volume: (cm) 3.923 % Reduction in Area: 50.7% % Reduction in Volume: -48% Epithelialization: Small (1-33%) Tunneling: Yes Position (o'clock): 12 Maximum Distance: (cm) 1.7 Undermining: No Wound Description Classification: Full Thickness Without Exposed Support Structures Wound Margin: Distinct, outline attached Exudate Amount: Medium Exudate Type: Serosanguineous Exudate Color: red, brown Foul Odor After Cleansing: No Slough/Fibrino Yes Wound Bed Granulation Amount: Large (67-100%) Exposed Structure Granulation Quality: Red Fascia Exposed: No Necrotic Amount: None Present (0%) Fat Layer (Subcutaneous Tissue) Exposed: Yes Tendon Exposed: No Muscle Exposed: No Joint Exposed: No Bone Exposed: No Treatment Notes Wound #1 (Lower Leg) Wound Laterality: Right, Medial Cleanser Soap and Water Discharge Instruction: May shower and wash wound with dial antibacterial soap and water prior to dressing change. Wound Cleanser Discharge Instruction: Cleanse the wound with wound cleanser prior to applying a clean dressing using gauze sponges, not tissue or cotton balls. Peri-Wound Care Sween Lotion (Moisturizing lotion) Discharge Instruction: Apply moisturizing lotion as directed Topical Primary Dressing Hydrofera Blue Ready Foam, 2.5 x2.5 in Discharge Instruction: Cut 2 smaller pieces for 2 deeper areas on sides then 1 in middle Secondary Dressing ABD Pad, 5x9 Discharge Instruction: Apply over primary dressing as directed. Secured With Compression Wrap ThreePress (3 layer compression wrap) Discharge Instruction: Apply three layer compression as directed. Compression Stockings Add-Ons Electronic Signature(s) Signed: 01/13/2021 9:12:52 AM By: Karl Ito Signed:  01/13/2021 5:35:19 PM By: Zenaida Deed RN, BSN Entered By: Karl Ito on 01/13/2021 08:27:18 -------------------------------------------------------------------------------- Vitals  Details Patient Name: Date of Service: Tammy Fitzpatrick, Tammy Fitzpatrick 01/13/2021 8:00 A M Medical Record Number: [id number] Patient Account Number: [id number] Date of Birth/Sex: Treating RN: 02/01/1951 (70 y.o. Roel Cluck Primary Care Oris Calmes: [name] Other Clinician: Referring Kaysee Hergert: Treating Simmie Garin/Extender: Skeet Simmer in Treatment: 2 Vital Signs Time Taken: 08:15 Temperature (F): 98.3 Height (in): 69 Pulse (bpm): 109 Source: Stated Respiratory Rate (breaths/min): 16 Weight (lbs): 250 Blood Pressure (mmHg): 176/98 Source: Stated Reference Range: 80 - 120 mg / dl Body Mass Index (BMI): 36.9 Electronic Signature(s) Signed: 01/13/2021 5:22:48 PM By: Antonieta Iba Entered By: Antonieta Iba on 01/13/2021 08:18:58

## 2021-01-16 LAB — CBC WITH DIFFERENTIAL/PLATELET
Absolute Monocytes: 415 cells/uL (ref 200–950)
Basophils Absolute: 30 cells/uL (ref 0–200)
Basophils Relative: 0.6 %
Eosinophils Absolute: 70 cells/uL (ref 15–500)
Eosinophils Relative: 1.4 %
HCT: 42.3 % (ref 35.0–45.0)
Hemoglobin: 13.7 g/dL (ref 11.7–15.5)
Lymphs Abs: 1755 cells/uL (ref 850–3900)
MCH: 28.8 pg (ref 27.0–33.0)
MCHC: 32.4 g/dL (ref 32.0–36.0)
MCV: 88.9 fL (ref 80.0–100.0)
MPV: 9.7 fL (ref 7.5–12.5)
Monocytes Relative: 8.3 %
Neutro Abs: 2730 cells/uL (ref 1500–7800)
Neutrophils Relative %: 54.6 %
Platelets: 261 10*3/uL (ref 140–400)
RBC: 4.76 10*6/uL (ref 3.80–5.10)
RDW: 13.4 % (ref 11.0–15.0)
Total Lymphocyte: 35.1 %
WBC: 5 10*3/uL (ref 3.8–10.8)

## 2021-01-16 LAB — COMPLETE METABOLIC PANEL WITH GFR
AG Ratio: 1.4 (calc) (ref 1.0–2.5)
ALT: 10 U/L (ref 6–29)
AST: 12 U/L (ref 10–35)
Albumin: 4.3 g/dL (ref 3.6–5.1)
Alkaline phosphatase (APISO): 55 U/L (ref 37–153)
BUN/Creatinine Ratio: 16 (calc) (ref 6–22)
BUN: 17 mg/dL (ref 7–25)
CO2: 25 mmol/L (ref 20–32)
Calcium: 9.4 mg/dL (ref 8.6–10.4)
Chloride: 105 mmol/L (ref 98–110)
Creat: 1.04 mg/dL — ABNORMAL HIGH (ref 0.60–1.00)
Globulin: 3 g/dL (calc) (ref 1.9–3.7)
Glucose, Bld: 114 mg/dL — ABNORMAL HIGH (ref 65–99)
Potassium: 3.9 mmol/L (ref 3.5–5.3)
Sodium: 140 mmol/L (ref 135–146)
Total Bilirubin: 0.5 mg/dL (ref 0.2–1.2)
Total Protein: 7.3 g/dL (ref 6.1–8.1)
eGFR: 58 mL/min/{1.73_m2} — ABNORMAL LOW (ref >=60)

## 2021-01-16 LAB — LIPID PANEL
Cholesterol: 242 mg/dL — ABNORMAL HIGH (ref <200)
HDL: 79 mg/dL (ref >=50)
LDL Cholesterol (Calc): 140 mg/dL (calc) — ABNORMAL HIGH
Non-HDL Cholesterol (Calc): 163 mg/dL (calc) — ABNORMAL HIGH (ref <130)
Total CHOL/HDL Ratio: 3.1 (calc) (ref <5.0)
Triglycerides: 117 mg/dL (ref <150)

## 2021-01-16 LAB — HEPATITIS C ANTIBODY
Hepatitis C Ab: NONREACTIVE
SIGNAL TO CUT-OFF: 0.01 (ref <1.00)

## 2021-01-16 LAB — HEMOGLOBIN A1C
Hgb A1c MFr Bld: 5.6 % of total Hgb (ref <5.7)
Mean Plasma Glucose: 114 mg/dL
eAG (mmol/L): 6.3 mmol/L

## 2021-01-16 LAB — TSH: TSH: 2.99 mIU/L (ref 0.40–4.50)

## 2021-01-20 ENCOUNTER — Other Ambulatory Visit: Payer: Self-pay

## 2021-01-20 ENCOUNTER — Encounter (HOSPITAL_BASED_OUTPATIENT_CLINIC_OR_DEPARTMENT_OTHER): Payer: Medicare Other | Admitting: Physician Assistant

## 2021-01-20 DIAGNOSIS — L97812 Non-pressure chronic ulcer of other part of right lower leg with fat layer exposed: Secondary | ICD-10-CM | POA: Diagnosis not present

## 2021-01-20 NOTE — Progress Notes (Addendum)
Tammy Fitzpatrick, Tammy Fitzpatrick ([id number]) Visit Report for 01/20/2021 Chief Complaint Document Details Patient Name: Date of Service: Tammy Fitzpatrick, Tammy Fitzpatrick 01/20/2021 10:15 A M Medical Record Number: [id number] Patient Account Number: [id number] Date of Birth/Sex: Treating RN: [birth year] (70 y.o. Tammy Fitzpatrick) Fitzpatrick, Tammy Primary Care Provider: Richarda Fitzpatrick, Tammy Other Clinician: Referring Provider: Treating Provider/Extender: Tammy Fitzpatrick, Tammy Deyo Fitzpatrick, Tammy Weeks in Treatment: 3 Information Obtained from: Patient Chief Complaint Right leg ulcer following dog scratch June 2022 Electronic Signature(s) Signed: 01/20/2021 10:54:37 AM By: Lenda KelpStone Fitzpatrick, Akiyah Eppolito PA-C Entered By: Lenda KelpStone Fitzpatrick, Annastyn Silvey on 01/20/2021 10:54:36 -------------------------------------------------------------------------------- HPI Details Patient Name: Date of Service: Tammy Fitzpatrick, Tammy Fitzpatrick 01/20/2021 10:15 A M Medical Record Number: [id number] Patient Account Number: [id number] Date of Birth/Sex: Treating RN: [birth year] (70 y.o. Tammy Fitzpatrick) Fitzpatrick, Tammy Primary Care Provider: Richarda Fitzpatrick, Tammy Other Clinician: Referring Provider: Treating Provider/Extender: Tammy Fitzpatrick, Kolden Dupee Fitzpatrick, Tammy Weeks in Treatment: 3 History of Present Illness HPI Description: 12/28/2020 upon evaluation today patient presents for initial evaluation here in our clinic concerning a wound that she has over the right medial lower leg. This is subsequently a result of initially a trauma from her dog where he jumped up on her and caused a laceration. With that being said unfortunately the patient has been having issues with this since that time trying to get this healed. Specifically the initial injury occurred to November 16, 2020. The sutures did seem to be his around 12/07/2020 she was placed on doxycycline. Following that time she has been using Xeroform and a border gauze dressing currently which again is keeping it covered but that is about it. With regard to medical history the patient does have  what appears to be an obvious history of chronic venous insufficiency and lymphedema. She also has high blood pressure noted today.. 01/06/2021 upon evaluation today patient appears to be doing well with regard to her wound. This actually appears to be significantly improved as compared to last week. She has been tolerating the dressing changes. In fact there really does not appear to be anything Require sharp debridement today as she is truly doing so well. She does not necessarily like the compression wrap which to be honest I completely understand but nonetheless I do believe its been beneficial as well as far as getting this wound to heal appropriately. 01/13/2021 upon evaluation today patient appears to be doing well with regard to her leg ulcer. She has been tolerating the dressing changes without complication. Hydrofera Blue was done excellent job up to this point. With that being said I do not believe that we are seeing any signs of worsening and in fact everything appears to be significantly improved compared to previous. 01/20/2021 upon evaluation today patient appears to be doing much better in regard to her wound. This is actually showing signs of significant improvement which is also news. I do not see any evidence of active infection which is also great and in general I think that we are headed in the appropriate direction. Electronic Signature(s) Signed: 01/20/2021 5:04:11 PM By: Lenda KelpStone Fitzpatrick, Melinna Linarez PA-C Entered By: Lenda KelpStone Fitzpatrick, Yalena Colon on 01/20/2021 17:04:11 -------------------------------------------------------------------------------- Physical Exam Details Patient Name: Date of Service: Tammy Fitzpatrick, Tammy Fitzpatrick 01/20/2021 10:15 A M Medical Record Number: [id number] Patient Account Number: [id number] Date of Birth/Sex: Treating RN: [birth year] (70 y.o. Tammy Fitzpatrick) Fitzpatrick, Tammy Primary Care Provider: Richarda Fitzpatrick, Tammy Other Clinician: Referring Provider: Treating Provider/Extender: Lenda KelpStone Fitzpatrick, Page Lancon Fitzpatrick,  Tammy Weeks in Treatment: 3 Constitutional Well-nourished and well-hydrated in no acute distress. Respiratory normal breathing without difficulty. Psychiatric this patient is  able to make decisions and demonstrates good insight into disease process. Alert and Oriented x 3. pleasant and cooperative. Notes Upon inspection patient's wound bed showed signs of good granulation epithelization at this point. Fortunately there does not appear to be any evidence of infection I think the Hydrofera Blue was actually doing an awesome job it was sticking a little bit around the edges of the wound but again I think this is something we can mitigate with careful attention to how the dressing is applied. Electronic Signature(s) Signed: 01/20/2021 5:04:42 PM By: Lenda Kelp PA-C Entered By: Lenda Kelp on 01/20/2021 17:04:42 -------------------------------------------------------------------------------- Physician Orders Details Patient Name: Date of Service: [name], [name] 01/20/2021 10:15 A M Medical Record Number: [id number] Patient Account Number: [id number] Date of Birth/Sex: Treating RN: Jul 19, 1950 (70 y.o. Tammy Fitzpatrick Primary Care Provider: [name] Other Clinician: Referring Provider: Treating Provider/Extender: Tammy Noe, Tammy Weeks in Treatment: 3 Verbal / Phone Orders: No Diagnosis Coding ICD-10 Coding Code Description I87.2 Venous insufficiency (chronic) (peripheral) I89.0 Lymphedema, not elsewhere classified L97.812 Non-pressure chronic ulcer of other part of right lower leg with fat layer exposed I10 Essential (primary) hypertension Follow-up Appointments ppointment in 1 week. - with Leonard Schwartz Return A Bathing/ Shower/ Hygiene May shower with protection but do not get wound dressing(s) wet. Edema Control - Lymphedema / SCD / Other Elevate legs to the level of the heart or above for 30 minutes daily and/or when sitting, a frequency of: Avoid  standing for long periods of time. Additional Orders / Instructions Follow Nutritious Diet Wound Treatment Wound #1 - Lower Leg Wound Laterality: Right, Medial Cleanser: Soap and Water 1 x Per Week/15 Days Discharge Instructions: May shower and wash wound with dial antibacterial soap and water prior to dressing change. Cleanser: Wound Cleanser 1 x Per Week/15 Days Discharge Instructions: Cleanse the wound with wound cleanser prior to applying a clean dressing using gauze sponges, not tissue or cotton balls. Peri-Wound Care: Sween Lotion (Moisturizing lotion) 1 x Per Week/15 Days Discharge Instructions: Apply moisturizing lotion as directed Prim Dressing: Hydrofera Blue Ready Foam, 2.5 x2.5 in 1 x Per Week/15 Days ary Discharge Instructions: Cut 2 smaller pieces for 2 deeper areas on sides then 1 in middle Secondary Dressing: Woven Gauze Sponge, Non-Sterile 4x4 in 1 x Per Week/15 Days Discharge Instructions: Apply over primary dressing as directed. Compression Wrap: CoFlex TLC XL 2-layer Compression System 4x7 (in/yd) 1 x Per Week/15 Days Discharge Instructions: Apply CoFlex 2-layer compression as directed. (alt for 4 layer) Electronic Signature(s) Signed: 01/20/2021 4:49:46 PM By: Zenaida Deed RN, BSN Signed: 01/20/2021 5:21:16 PM By: Lenda Kelp PA-C Entered By: Zenaida Deed on 01/20/2021 11:29:19 -------------------------------------------------------------------------------- Problem List Details Patient Name: Date of Service: ALIZ, MERITT 01/20/2021 10:15 A M Medical Record Number: [id number] Patient Account Number: [id number] Date of Birth/Sex: Treating RN: 03-05-1951 (70 y.o. Tammy Fitzpatrick Primary Care Provider: [name] Other Clinician: Referring Provider: Treating Provider/Extender: Tammy Noe, Tammy Weeks in Treatment: 3 Active Problems ICD-10 Encounter Code Description Active Date MDM Diagnosis I87.2 Venous insufficiency (chronic)  (peripheral) 12/28/2020 No Yes I89.0 Lymphedema, not elsewhere classified 12/28/2020 No Yes L97.812 Non-pressure chronic ulcer of other part of right lower leg with fat layer 12/28/2020 No Yes exposed I10 Essential (primary) hypertension 12/28/2020 No Yes Inactive Problems Resolved Problems Electronic Signature(s) Signed: 01/20/2021 10:54:29 AM By: Lenda Kelp PA-C Entered By: Lenda Kelp on 01/20/2021 10:54:28 -------------------------------------------------------------------------------- Progress Note Details Patient Name: Date of Service: [place],  Brien Few 01/20/2021 10:15 A M Medical Record Number: [id number] Patient Account Number: [id number] Date of Birth/Sex: Treating RN: August 01, 1950 (70 y.o. Tammy Fitzpatrick Primary Care Provider: [name] Other Clinician: Referring Provider: Treating Provider/Extender: Sherlene Shams in Treatment: 3 Subjective Chief Complaint Information obtained from Patient Right leg ulcer following dog scratch June 2022 History of Present Illness (HPI) 12/28/2020 upon evaluation today patient presents for initial evaluation here in our clinic concerning a wound that she has over the right medial lower leg. This is subsequently a result of initially a trauma from her dog where he jumped up on her and caused a laceration. With that being said unfortunately the patient has been having issues with this since that time trying to get this healed. Specifically the initial injury occurred to November 16, 2020. The sutures did seem to be his around 12/07/2020 she was placed on doxycycline. Following that time she has been using Xeroform and a border gauze dressing currently which again is keeping it covered but that is about it. With regard to medical history the patient does have what appears to be an obvious history of chronic venous insufficiency and lymphedema. She also has high blood pressure noted today.. 01/06/2021 upon evaluation today  patient appears to be doing well with regard to her wound. This actually appears to be significantly improved as compared to last week. She has been tolerating the dressing changes. In fact there really does not appear to be anything Require sharp debridement today as she is truly doing so well. She does not necessarily like the compression wrap which to be honest I completely understand but nonetheless I do believe its been beneficial as well as far as getting this wound to heal appropriately. 01/13/2021 upon evaluation today patient appears to be doing well with regard to her leg ulcer. She has been tolerating the dressing changes without complication. Hydrofera Blue was done excellent job up to this point. With that being said I do not believe that we are seeing any signs of worsening and in fact everything appears to be significantly improved compared to previous. 01/20/2021 upon evaluation today patient appears to be doing much better in regard to her wound. This is actually showing signs of significant improvement which is also news. I do not see any evidence of active infection which is also great and in general I think that we are headed in the appropriate direction. Objective Constitutional Well-nourished and well-hydrated in no acute distress. Vitals Time Taken: 10:20 AM, Height: 69 in, Source: Stated, Weight: 250 lbs, Source: Stated, BMI: 36.9, Temperature: 98.8 F, Pulse: 121 bpm, Respiratory Rate: 20 breaths/min, Blood Pressure: 133/89 mmHg. Respiratory normal breathing without difficulty. Psychiatric this patient is able to make decisions and demonstrates good insight into disease process. Alert and Oriented x 3. pleasant and cooperative. General Notes: Upon inspection patient's wound bed showed signs of good granulation epithelization at this point. Fortunately there does not appear to be any evidence of infection I think the Hydrofera Blue was actually doing an awesome job it was  sticking a little bit around the edges of the wound but again I think this is something we can mitigate with careful attention to how the dressing is applied. Integumentary (Hair, Skin) Wound #1 status is Open. Original cause of wound was Trauma. The date acquired was: 09/21/2020. The wound has been in treatment 3 weeks. The wound is located on the Right,Medial Lower Leg. The wound measures 0.8cm length x 2.8cm width  x 0.6cm depth; 1.759cm^2 area and 1.056cm^3 volume. There is Fat Layer (Subcutaneous Tissue) exposed. There is no undermining noted, however, there is tunneling at 12:00 with a maximum distance of 1.6cm. There is a medium amount of serosanguineous drainage noted. The wound margin is distinct with the outline attached to the wound base. There is large (67-100%) red granulation within the wound bed. There is no necrotic tissue within the wound bed. Assessment Active Problems ICD-10 Venous insufficiency (chronic) (peripheral) Lymphedema, not elsewhere classified Non-pressure chronic ulcer of other part of right lower leg with fat layer exposed Essential (primary) hypertension Procedures Wound #1 Pre-procedure diagnosis of Wound #1 is a Lymphedema located on the Right,Medial Lower Leg . There was a Double Layer Compression Therapy Procedure by Zenaida Deed, RN. Post procedure Diagnosis Wound #1: Same as Pre-Procedure Plan Follow-up Appointments: Return Appointment in 1 week. - with Luana Shu Shower/ Hygiene: May shower with protection but do not get wound dressing(s) wet. Edema Control - Lymphedema / SCD / Other: Elevate legs to the level of the heart or above for 30 minutes daily and/or when sitting, a frequency of: Avoid standing for long periods of time. Additional Orders / Instructions: Follow Nutritious Diet WOUND #1: - Lower Leg Wound Laterality: Right, Medial Cleanser: Soap and Water 1 x Per Week/15 Days Discharge Instructions: May shower and wash wound with dial  antibacterial soap and water prior to dressing change. Cleanser: Wound Cleanser 1 x Per Week/15 Days Discharge Instructions: Cleanse the wound with wound cleanser prior to applying a clean dressing using gauze sponges, not tissue or cotton balls. Peri-Wound Care: Sween Lotion (Moisturizing lotion) 1 x Per Week/15 Days Discharge Instructions: Apply moisturizing lotion as directed Prim Dressing: Hydrofera Blue Ready Foam, 2.5 x2.5 in 1 x Per Week/15 Days ary Discharge Instructions: Cut 2 smaller pieces for 2 deeper areas on sides then 1 in middle Secondary Dressing: Woven Gauze Sponge, Non-Sterile 4x4 in 1 x Per Week/15 Days Discharge Instructions: Apply over primary dressing as directed. Com pression Wrap: CoFlex TLC XL 2-layer Compression System 4x7 (in/yd) 1 x Per Week/15 Days Discharge Instructions: Apply CoFlex 2-layer compression as directed. (alt for 4 layer) 1. Would recommend currently that we going continue with wound care measures as before and the patient is in agreement the plan this includes the use of the [facility] dressing which I feel like the patient is actually doing quite well with. 2. I am also can recommend that we have the patient continue with the 2 layer compression wrap. 3. I am also going to recommend that we have the patient continue to monitor for any signs of worsening or infection such as increased pain and no she has the wrap on that if she develops any issues she should let us know ASAP. We will see patient back for reevaluation in 1 week here in the clinic. If anything worsens or changes patient will contact our office for additional recommendations. Electronic Signature(s) Signed: 01/20/2021 5:05:24 PM By: Lenda Kelp PA-C Entered By: Lenda Kelp on 01/20/2021 17:05:23 -------------------------------------------------------------------------------- SuperBill Details Patient Name: Date of Service: DANYLLE, OUK 01/20/2021 Medical Record  Number: [id number] Patient Account Number: [id number] Date of Birth/Sex: Treating RN: 07-14-1950 (70 y.o. Tammy Fitzpatrick Primary Care Provider: [name] Other Clinician: Referring Provider: Treating Provider/Extender: Tammy Noe, Tammy Weeks in Treatment: 3 Diagnosis Coding ICD-10 Codes Code Description I87.2 Venous insufficiency (chronic) (peripheral) I89.0 Lymphedema, not elsewhere classified L97.812 Non-pressure chronic ulcer of other part of right  lower leg with fat layer exposed I10 Essential (primary) hypertension Facility Procedures CPT4 Code: [id number] Description: (Facility Use Only) [phone] - APPLY MULTLAY COMPRS LWR RT LEG Modifier: Quantity: 1 Physician Procedures : CPT4 Code Description Modifier [id number] - WC PHYS LEVEL 3 - EST PT ICD-10 Diagnosis Description I87.2 Venous insufficiency (chronic) (peripheral) I89.0 Lymphedema, not elsewhere classified L97.812 Non-pressure chronic ulcer of other part of right  lower leg with fat layer exposed I10 Essential (primary) hypertension Quantity: 1 Electronic Signature(s) Signed: 01/20/2021 5:05:40 PM By: Lenda Kelp PA-C Previous Signature: 01/20/2021 4:49:46 PM Version By: Zenaida Deed RN, BSN Entered By: Lenda Kelp on 01/20/2021 17:05:40

## 2021-01-20 NOTE — Progress Notes (Signed)
Tammy Fitzpatrick, Tammy Fitzpatrick ([id number]) Visit Report for 01/20/2021 Arrival Information Details Patient Name: Date of Service: Tammy Fitzpatrick, Tammy Fitzpatrick 01/20/2021 10:15 A M Medical Record Number: [id number] Patient Account Number: [id number] Date of Birth/Sex: Treating RN: 10-27-1950 (70 y.o. [name] Primary Care Latavia Goga: [name] Other Clinician: Referring Marcoantonio Legault: Treating Poonam Woehrle/Extender: Erie Noe, Dinah Weeks in Treatment: 3 Visit Information History Since Last Visit Added or deleted any medications: No Patient Arrived: Ambulatory Any new allergies or adverse reactions: No Arrival Time: 10:15 Had a fall or experienced change in No Accompanied By: self activities of daily living that may affect Transfer Assistance: None risk of falls: Patient Identification Verified: Yes Signs or symptoms of abuse/neglect since last visito No Secondary Verification Process Completed: Yes Hospitalized since last visit: No Patient Requires Transmission-Based Precautions: No Implantable device outside of the clinic excluding No Patient Has Alerts: No cellular tissue based products placed in the center since last visit: Has Dressing in Place as Prescribed: Yes Has Compression in Place as Prescribed: Yes Pain Present Now: No Electronic Signature(s) Signed: 01/20/2021 4:49:46 PM By: Zenaida Deed RN, BSN Entered By: Zenaida Deed on 01/20/2021 10:19:25 -------------------------------------------------------------------------------- Compression Therapy Details Patient Name: Date of Service: TAKYAH, CIARAMITARO 01/20/2021 10:15 A M Medical Record Number: [id number] Patient Account Number: [id number] Date of Birth/Sex: Treating RN: 12/31/1950 (70 y.o. [name] Primary Care Brandice Busser: [name] Other Clinician: Referring Everado Pillsbury: Treating Snyder Colavito/Extender: Erie Noe, Dinah Weeks in Treatment: 3 Compression Therapy Performed for Wound Assessment:  Wound #1 Right,Medial Lower Leg Performed By: Clinician Zenaida Deed, RN Compression Type: Double Layer Post Procedure Diagnosis Same as Pre-procedure Electronic Signature(s) Signed: 01/20/2021 4:49:46 PM By: Zenaida Deed RN, BSN Entered By: Zenaida Deed on 01/20/2021 11:28:04 -------------------------------------------------------------------------------- Encounter Discharge Information Details Patient Name: Date of Service: Tammy Fitzpatrick, Tammy Fitzpatrick 01/20/2021 10:15 A M Medical Record Number: [id number] Patient Account Number: [id number] Date of Birth/Sex: Treating RN: [birth year] (70 y.o. [name] Primary Care Nalleli Largent: [name] Other Clinician: Referring Christiann Hagerty: Treating Andersen Mckiver/Extender: Erie Noe, Dinah Weeks in Treatment: 3 Encounter Discharge Information Items Discharge Condition: Stable Ambulatory Status: Ambulatory Discharge Destination: Home Transportation: Private Auto Accompanied By: self Schedule Follow-up Appointment: Yes Clinical Summary of Care: Patient Declined Electronic Signature(s) Signed: 01/20/2021 4:49:46 PM By: Zenaida Deed RN, BSN Entered By: Zenaida Deed on 01/20/2021 11:39:28 -------------------------------------------------------------------------------- Lower Extremity Assessment Details Patient Name: Date of Service: Tammy Fitzpatrick, Tammy Fitzpatrick 01/20/2021 10:15 A M Medical Record Number: [id number] Patient Account Number: [id number] Date of Birth/Sex: Treating RN: [birth year] (70 y.o. [name] Primary Care Deryl Giroux: [name] Other Clinician: Referring Fabian Walder: Treating Lollie Gunner/Extender: Lenda Kelp Ngetich, Dinah Weeks in Treatment: 3 Edema Assessment Assessed: [Left: No] [Right: No] Edema: [Left: Ye] [Right: s] Calf Left: Right: Point of Measurement: 29 cm From Medial Instep 49 cm Ankle Left: Right: Point of Measurement: 9 cm From Medial Instep 31 cm Vascular  Assessment Pulses: Dorsalis Pedis Palpable: [Right:Yes] Electronic Signature(s) Signed: 01/20/2021 4:49:46 PM By: Zenaida Deed RN, BSN Entered By: Zenaida Deed on 01/20/2021 10:26:22 -------------------------------------------------------------------------------- Multi Wound Chart Details Patient Name: Date of Service: Tammy Fitzpatrick, Tammy Fitzpatrick 01/20/2021 10:15 A M Medical Record Number: [id number] Patient Account Number: [id number] Date of Birth/Sex: Treating RN: [birth year] (70 y.o. [name] Primary Care Rayven Rettig: [name] Other Clinician: Referring Jenia Klepper: Treating Ardith Lewman/Extender: Erie Noe, Dinah Weeks in Treatment: 3 Vital Signs Height(in): 69 Pulse(bpm): 121 Weight(lbs): 250 Blood Pressure(mmHg): 133/89 Body Mass Index(BMI): 37 Temperature(F): 98.8 Respiratory Rate(breaths/min): 20 Photos: [N/A:N/A] Right,  Medial Lower Leg N/A N/A Wound Location: Trauma N/A N/A Wounding Event: Lymphedema N/A N/A Primary Etiology: Hypertension, Osteoarthritis N/A N/A Comorbid History: 09/21/2020 N/A N/A Date Acquired: 3 N/A N/A Weeks of Treatment: Open N/A N/A Wound Status: 0.8x2.8x0.6 N/A N/A Measurements L x W x D (cm) 1.759 N/A N/A A (cm) : rea 1.056 N/A N/A Volume (cm) : 80.10% N/A N/A % Reduction in A rea: 60.20% N/A N/A % Reduction in Volume: 12 Position 1 (o'clock): 1.6 Maximum Distance 1 (cm): Yes N/A N/A Tunneling: Full Thickness Without Exposed N/A N/A Classification: Support Structures Medium N/A N/A Exudate Amount: Serosanguineous N/A N/A Exudate Type: red, brown N/A N/A Exudate Color: Distinct, outline attached N/A N/A Wound Margin: Large (67-100%) N/A N/A Granulation Amount: Red N/A N/A Granulation Quality: None Present (0%) N/A N/A Necrotic Amount: Fat Layer (Subcutaneous Tissue): Yes N/A N/A Exposed Structures: Fascia: No Tendon: No Muscle: No Joint: No Bone: No Small (1-33%) N/A  N/A Epithelialization: Treatment Notes Electronic Signature(s) Signed: 01/20/2021 4:49:46 PM By: Zenaida Deed RN, BSN Entered By: Zenaida Deed on 01/20/2021 10:31:55 -------------------------------------------------------------------------------- Multi-Disciplinary Care Plan Details Patient Name: Date of Service: Tammy Fitzpatrick, Tammy Fitzpatrick 01/20/2021 10:15 A M Medical Record Number: [id number] Patient Account Number: [id number] Date of Birth/Sex: Treating RN: [birth year] (70 y.o. [name] Primary Care Jaelah Hauth: [name] Other Clinician: Referring Denita Lun: Treating Chantal Worthey/Extender: Lenda Kelp Ngetich, Dinah Weeks in Treatment: 3 Active Inactive Wound/Skin Impairment Nursing Diagnoses: Impaired tissue integrity Goals: Patient/caregiver will verbalize understanding of skin care regimen Date Initiated: 12/28/2020 Target Resolution Date: 01/25/2021 Goal Status: Active Ulcer/skin breakdown will have a volume reduction of 30% by week 4 Date Initiated: 12/28/2020 Target Resolution Date: 01/25/2021 Goal Status: Active Interventions: Assess patient/caregiver ability to obtain necessary supplies Assess patient/caregiver ability to perform ulcer/skin care regimen upon admission and as needed Assess ulceration(s) every visit Provide education on ulcer and skin care Treatment Activities: Topical wound management initiated : 12/28/2020 Notes: Electronic Signature(s) Signed: 01/20/2021 4:49:46 PM By: Zenaida Deed RN, BSN Entered By: Zenaida Deed on 01/20/2021 10:31:04 -------------------------------------------------------------------------------- Pain Assessment Details Patient Name: Date of Service: Tammy Fitzpatrick, Tammy Fitzpatrick 01/20/2021 10:15 A M Medical Record Number: [id number] Patient Account Number: [id number] Date of Birth/Sex: Treating RN: 1950/07/04 (70 y.o. [name] Primary Care Gracielynn Birkel: [name] Other Clinician: Referring Joeline Freer: Treating  Kal Chait/Extender: Erie Noe, Dinah Weeks in Treatment: 3 Active Problems Location of Pain Severity and Description of Pain Patient Has Paino No Site Locations Rate the pain. Rate the pain. Current Pain Level: 0 Pain Management and Medication Current Pain Management: Electronic Signature(s) Signed: 01/20/2021 4:49:46 PM By: Zenaida Deed RN, BSN Entered By: Zenaida Deed on 01/20/2021 10:20:39 -------------------------------------------------------------------------------- Patient/Caregiver Education Details Patient Name: Date of Service: Tammy Fitzpatrick 8/31/2022andnbsp10:15 A M Medical Record Number: [id number] Patient Account Number: [id number] Date of Birth/Gender: Treating RN: [birth year] (70 y.o. [name] Primary Care Physician: [name] Other Clinician: Referring Physician: Treating Physician/Extender: Sherlene Shams in Treatment: 3 Education Assessment Education Provided To: Patient Education Topics Provided Venous: Methods: Explain/Verbal Responses: Reinforcements needed, State content correctly Wound/Skin Impairment: Methods: Explain/Verbal Responses: Reinforcements needed, State content correctly Electronic Signature(s) Signed: 01/20/2021 4:49:46 PM By: Zenaida Deed RN, BSN Entered By: Zenaida Deed on 01/20/2021 10:31:23 -------------------------------------------------------------------------------- Wound Assessment Details Patient Name: Date of Service: Tammy Fitzpatrick, Tammy Fitzpatrick 01/20/2021 10:15 A M Medical Record Number: [id number] Patient Account Number: [id number] Date of Birth/Sex: Treating RN: 08-10-1950 (70 y.o. [name] Primary Care Romy Mcgue: [name] Other Clinician: Referring  Jariah Tarkowski: Treating Josel Keo/Extender: Lenda Kelp Ngetich, Dinah Weeks in Treatment: 3 Wound Status Wound Number: 1 Primary Etiology: Lymphedema Wound Location: Right, Medial Lower Leg Wound  Status: Open Wounding Event: Trauma Comorbid History: Hypertension, Osteoarthritis Date Acquired: 09/21/2020 Weeks Of Treatment: 3 Clustered Wound: No Photos Wound Measurements Length: (cm) 0.8 Width: (cm) 2.8 Depth: (cm) 0.6 Area: (cm) 1.759 Volume: (cm) 1.056 % Reduction in Area: 80.1% % Reduction in Volume: 60.2% Epithelialization: Small (1-33%) Tunneling: Yes Position (o'clock): 12 Maximum Distance: (cm) 1.6 Undermining: No Wound Description Classification: Full Thickness Without Exposed Support Structures Wound Margin: Distinct, outline attached Exudate Amount: Medium Exudate Type: Serosanguineous Exudate Color: red, brown Foul Odor After Cleansing: No Slough/Fibrino No Wound Bed Granulation Amount: Large (67-100%) Exposed Structure Granulation Quality: Red Fascia Exposed: No Necrotic Amount: None Present (0%) Fat Layer (Subcutaneous Tissue) Exposed: Yes Tendon Exposed: No Muscle Exposed: No Joint Exposed: No Bone Exposed: No Treatment Notes Wound #1 (Lower Leg) Wound Laterality: Right, Medial Cleanser Soap and Water Discharge Instruction: May shower and wash wound with dial antibacterial soap and water prior to dressing change. Wound Cleanser Discharge Instruction: Cleanse the wound with wound cleanser prior to applying a clean dressing using gauze sponges, not tissue or cotton balls. Peri-Wound Care Sween Lotion (Moisturizing lotion) Discharge Instruction: Apply moisturizing lotion as directed Topical Primary Dressing Hydrofera Blue Ready Foam, 2.5 x2.5 in Discharge Instruction: Cut 2 smaller pieces for 2 deeper areas on sides then 1 in middle Secondary Dressing Woven Gauze Sponge, Non-Sterile 4x4 in Discharge Instruction: Apply over primary dressing as directed. Secured With Compression Wrap CoFlex TLC XL 2-layer Compression System 4x7 (in/yd) Discharge Instruction: Apply CoFlex 2-layer compression as directed. (alt for 4 layer) Compression  Stockings Add-Ons Electronic Signature(s) Signed: 01/20/2021 4:49:46 PM By: Zenaida Deed RN, BSN Signed: 01/20/2021 6:06:32 PM By: Shawn Stall Entered By: Shawn Stall on 01/20/2021 10:30:20 -------------------------------------------------------------------------------- Vitals Details Patient Name: Date of Service: Tammy Fitzpatrick, Tammy Fitzpatrick 01/20/2021 10:15 A M Medical Record Number: [id number] Patient Account Number: [id number] Date of Birth/Sex: Treating RN: 05-18-1951 (70 y.o. [name] Primary Care Willis Holquin: [name] Other Clinician: Referring Gaylon Melchor: Treating Dylyn Mclaren/Extender: Erie Noe, Dinah Weeks in Treatment: 3 Vital Signs Time Taken: 10:20 Temperature (F): 98.8 Height (in): 69 Pulse (bpm): 121 Source: Stated Respiratory Rate (breaths/min): 20 Weight (lbs): 250 Blood Pressure (mmHg): 133/89 Source: Stated Reference Range: 80 - 120 mg / dl Body Mass Index (BMI): 36.9 Electronic Signature(s) Signed: 01/20/2021 4:49:46 PM By: Zenaida Deed RN, BSN Entered By: Zenaida Deed on 01/20/2021 10:20:31

## 2021-01-26 IMAGING — CT CT ABDOMEN AND PELVIS WITH CONTRAST
2 of 5 series · 15 of 46 positions shown, 17 images · IV contrast (OMNIPAQUE)
Comparison: None.

CLINICAL DATA: 67-year-old with acute onset crampy abdominal pain
and 4-5 episodes of bloody loose stools that began this morning.

EXAM:
CT ABDOMEN AND PELVIS WITH CONTRAST
TECHNIQUE: Multidetector CT imaging of the abdomen and pelvis was performed
using the standard protocol following bolus administration of
intravenous contrast.
CONTRAST:  100mL OMNIPAQUE IOHEXOL 300 MG/ML IV.

[Series 2: axial st · axial · 0.68mm/px · z∈[-52,+338]mm · 12 of 90 slices shown, 14 images]
[im 6/90  soft-tissue]
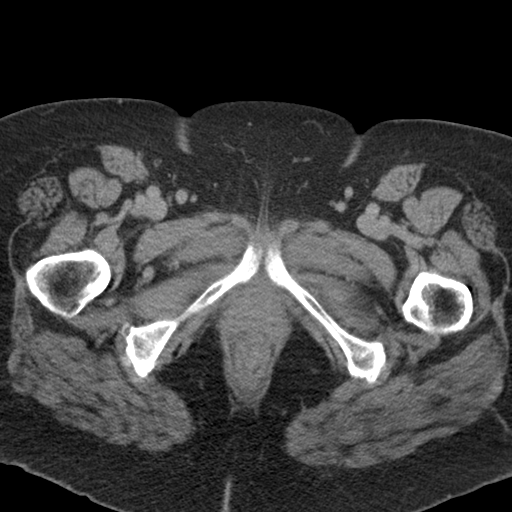
[im 6/90  bone]
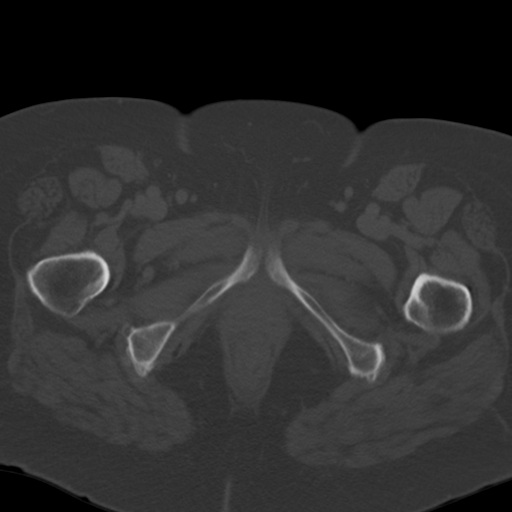
[im 12/90  soft-tissue]
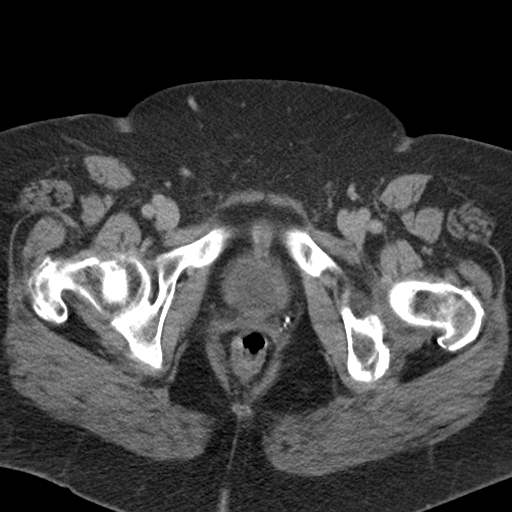
[im 23/90  soft-tissue]
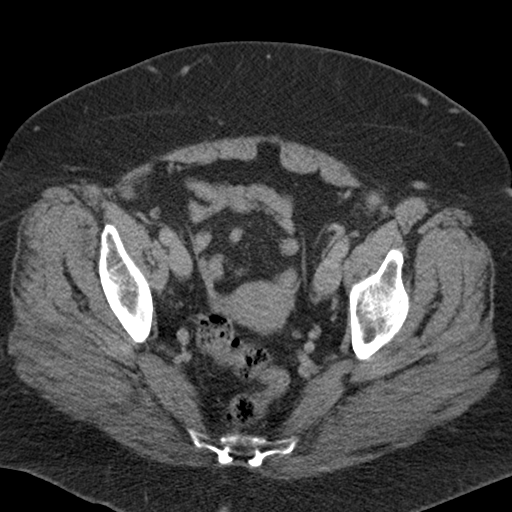
[im 28/90  soft-tissue]
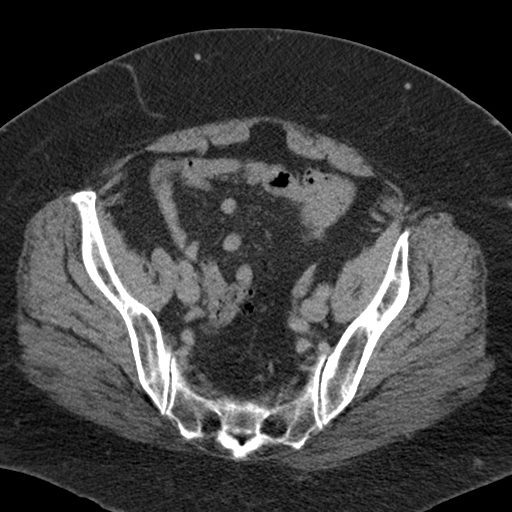
[im 34/90  soft-tissue]
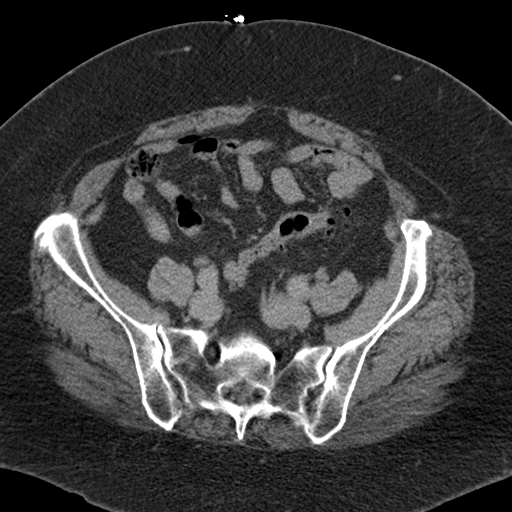
[im 39/90  soft-tissue]
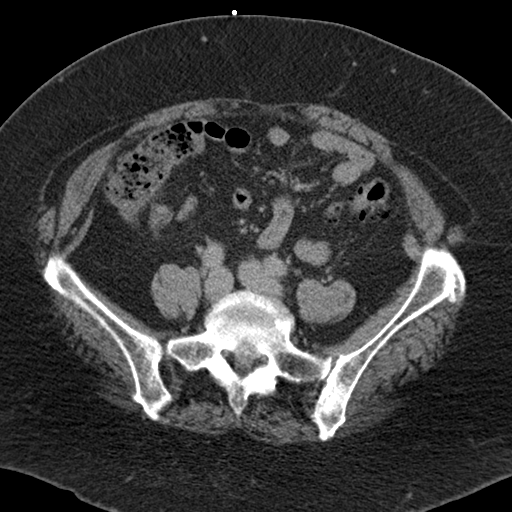
[im 51/90  soft-tissue]
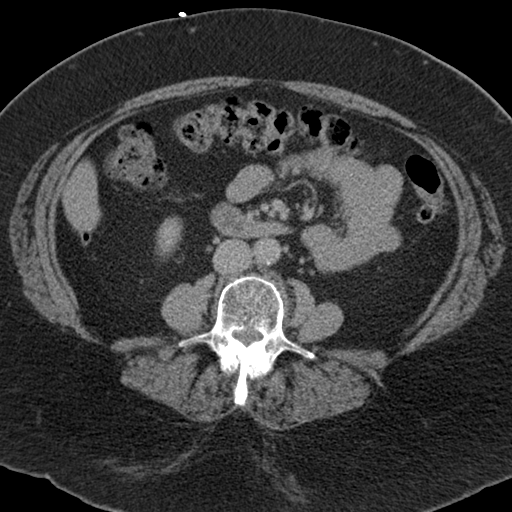
[im 56/90  soft-tissue]
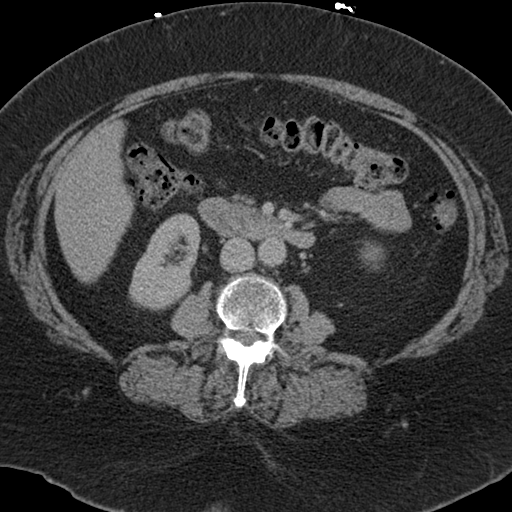
[im 62/90  soft-tissue]
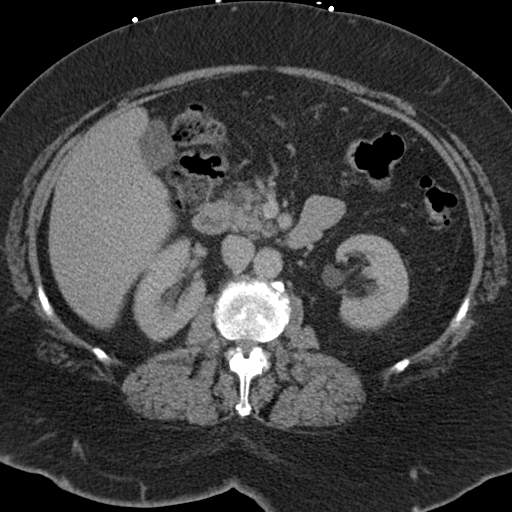
[im 62/90  bone]
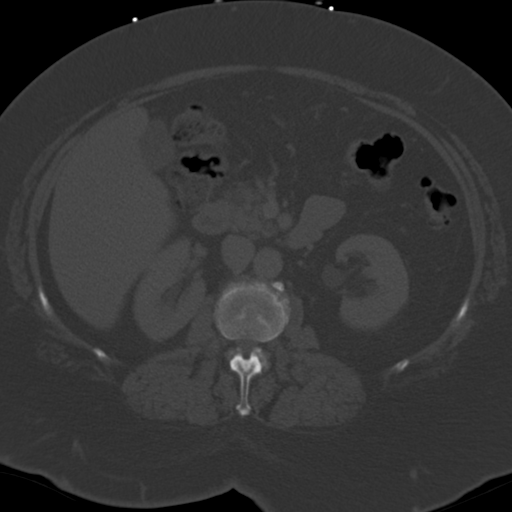
[im 67/90  soft-tissue]
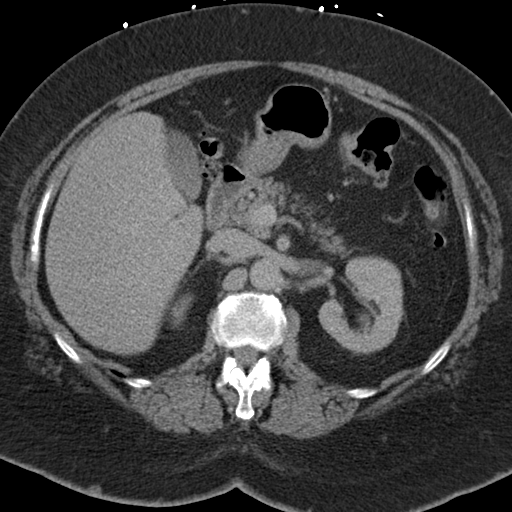
[im 78/90  soft-tissue]
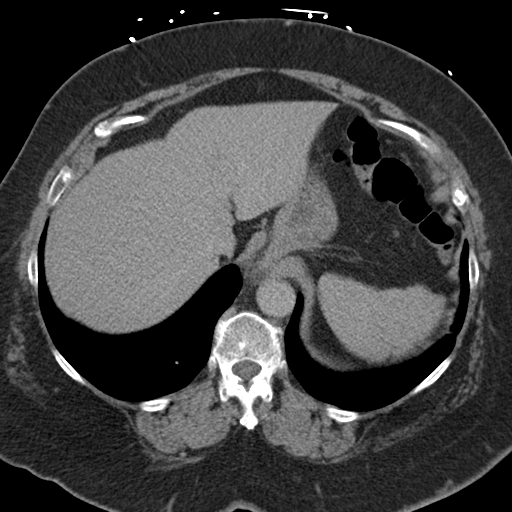
[im 84/90  soft-tissue]
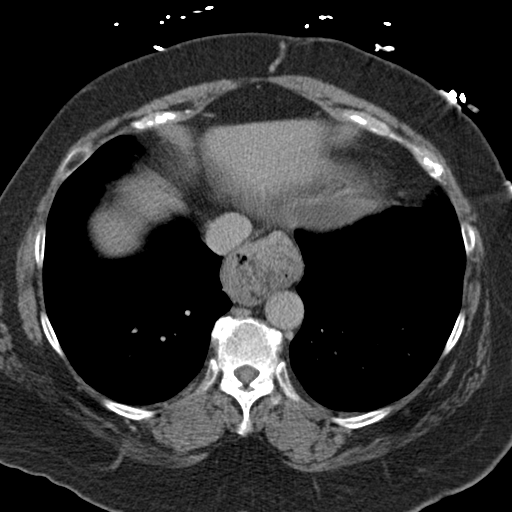

[Series 5: coronal st · coronal · 0.67mm/px · 3 of 151 slices shown]
[im 51/151  soft-tissue]
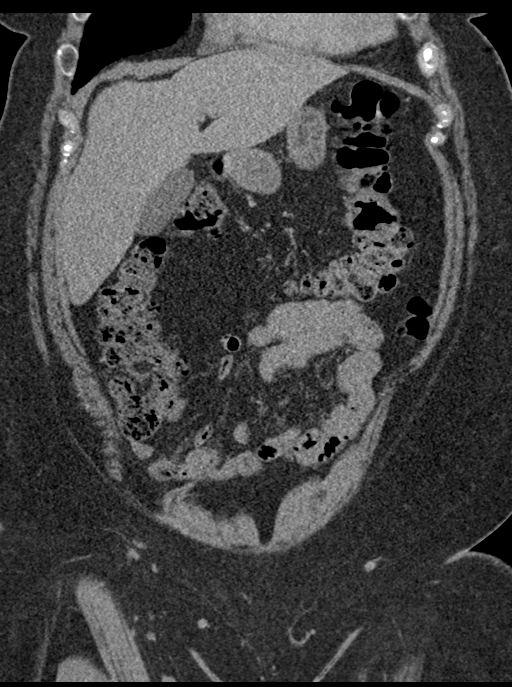
[im 67/151  soft-tissue]
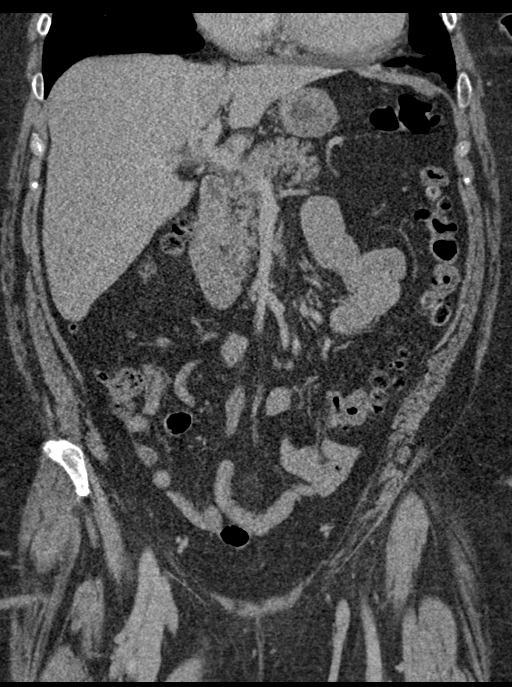
[im 84/151  soft-tissue]
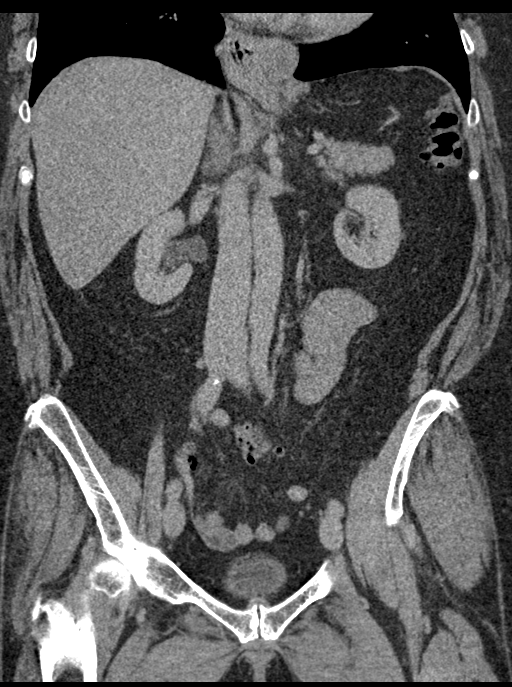

[15 of 46 positions shown; findings below may reference images not displayed]

FINDINGS: Lower chest: Heart size upper normal. Visualized lung bases clear.

Hepatobiliary: Liver normal in size and appearance. Gallbladder
normal in appearance without calcified gallstones. No biliary ductal
dilation.

Pancreas: Normal in appearance without evidence of mass, ductal
dilation, or inflammation.

Spleen: Normal in size and appearance.

Adrenals/Urinary Tract: Normal appearing adrenal glands. Kidneys
normal in size and appearance without focal parenchymal abnormality.
No hydronephrosis. No evidence of urinary tract calculi. Urinary
bladder decompressed and unremarkable.

Stomach/Bowel: Small to moderate-sized hiatal hernia. Stomach
otherwise normal in appearance. Normal-appearing small bowel.
Diffuse colonic diverticulosis without evidence of acute
diverticulitis. Mobile cecum positioned in the RIGHT UPPER QUADRANT
of the abdomen. Normal appearing gas-filled appendix in the RIGHT
UPPER QUADRANT with its tip adjacent to the inferior liver edge.

Vascular/Lymphatic: Mild abdominal aortic atherosclerosis without
evidence of aneurysm. Normal-appearing portal venous and systemic
venous systems.

No pathologic lymphadenopathy.

Reproductive: Normal-appearing atrophic uterus. No adnexal masses.

Other: Pelvic phleboliths. Small umbilical hernia containing fat.

Musculoskeletal: Osseous demineralization. Facet degenerative
changes throughout the lumbar spine. No acute findings.
IMPRESSION: 1. No acute abnormalities involving the abdomen or pelvis.
2. Diffuse colonic diverticulosis without evidence of acute
diverticulitis.
3. Small to moderate-sized hiatal hernia.
4.  Aortic Atherosclerosis, mild.  ([id number]-170.0)

## 2021-01-27 ENCOUNTER — Encounter (HOSPITAL_BASED_OUTPATIENT_CLINIC_OR_DEPARTMENT_OTHER): Payer: Medicare Other | Attending: Physician Assistant | Admitting: Physician Assistant

## 2021-01-27 ENCOUNTER — Other Ambulatory Visit: Payer: Self-pay

## 2021-01-27 DIAGNOSIS — I10 Essential (primary) hypertension: Secondary | ICD-10-CM | POA: Diagnosis not present

## 2021-01-27 DIAGNOSIS — I872 Venous insufficiency (chronic) (peripheral): Secondary | ICD-10-CM | POA: Diagnosis not present

## 2021-01-27 DIAGNOSIS — I890 Lymphedema, not elsewhere classified: Secondary | ICD-10-CM | POA: Diagnosis not present

## 2021-01-27 DIAGNOSIS — L97812 Non-pressure chronic ulcer of other part of right lower leg with fat layer exposed: Secondary | ICD-10-CM | POA: Diagnosis not present

## 2021-01-27 NOTE — Progress Notes (Signed)
Tammy, Fitzpatrick ([id number]) Visit Report for 01/27/2021 Arrival Information Details Patient Name: Date of Service: Tammy, Fitzpatrick 01/27/2021 8:15 A M Medical Record Number: [id number] Patient Account Number: [id number] Date of Birth/Sex: Treating RN: 21-Oct-1950 (70 y.o. Helene Shoe, Meta.Reding Primary Care Gracie Gupta: Marlowe Sax Other Clinician: Referring Starnisha Batrez: Treating Casidee Jann/Extender: Worthy Keeler Ngetich, Dinah Weeks in Treatment: 4 Visit Information History Since Last Visit Added or deleted any medications: No Patient Arrived: Ambulatory Any new allergies or adverse reactions: No Arrival Time: 08:25 Had a fall or experienced change in No Accompanied By: self activities of daily living that may affect Transfer Assistance: None risk of falls: Patient Identification Verified: Yes Signs or symptoms of abuse/neglect since last visito No Secondary Verification Process Completed: Yes Hospitalized since last visit: No Patient Requires Transmission-Based Precautions: No Implantable device outside of the clinic excluding No Patient Has Alerts: No cellular tissue based products placed in the center since last visit: Has Dressing in Place as Prescribed: Yes Has Compression in Place as Prescribed: Yes Pain Present Now: No Electronic Signature(s) Signed: 01/27/2021 5:29:03 PM By: Deon Pilling Entered By: Deon Pilling on 01/27/2021 08:25:56 -------------------------------------------------------------------------------- Compression Therapy Details Patient Name: Date of Service: Tammy, Fitzpatrick 01/27/2021 8:15 A M Medical Record Number: [id number] Patient Account Number: [id number] Date of Birth/Sex: Treating RN: Sep 23, 1950 (70 y.o. Elam Dutch Primary Care Shavy Beachem: Marlowe Sax Other Clinician: Referring Kennith Morss: Treating Laiken Sandy/Extender: Tyron Russell, Dinah Weeks in Treatment: 4 Compression Therapy Performed for Wound Assessment: Wound #1  Right,Medial Lower Leg Performed By: Clinician Baruch Gouty, RN Compression Type: Double Layer Post Procedure Diagnosis Same as Pre-procedure Electronic Signature(s) Signed: 01/27/2021 6:09:03 PM By: Baruch Gouty RN, BSN Entered By: Baruch Gouty on 01/27/2021 08:47:22 -------------------------------------------------------------------------------- Encounter Discharge Information Details Patient Name: Date of Service: Tammy, Fitzpatrick 01/27/2021 8:15 A M Medical Record Number: [id number] Patient Account Number: [id number] Date of Birth/Sex: Treating RN: March 31, 1951 (70 y.o. Elam Dutch Primary Care Renner Sebald: Marlowe Sax Other Clinician: Referring Nita Whitmire: Treating Roann Merk/Extender: Tyron Russell, Dinah Weeks in Treatment: 4 Encounter Discharge Information Items Discharge Condition: Stable Ambulatory Status: Ambulatory Discharge Destination: Home Transportation: Private Auto Accompanied By: self Schedule Follow-up Appointment: Yes Clinical Summary of Care: Patient Declined Electronic Signature(s) Signed: 01/27/2021 6:09:03 PM By: Baruch Gouty RN, BSN Entered By: Baruch Gouty on 01/27/2021 17:35:54 -------------------------------------------------------------------------------- Lower Extremity Assessment Details Patient Name: Date of Service: Tammy, Fitzpatrick 01/27/2021 8:15 A M Medical Record Number: [id number] Patient Account Number: [id number] Date of Birth/Sex: Treating RN: [birth year] (70 y.o. Debby Bud Primary Care Orian Amberg: Marlowe Sax Other Clinician: Referring Sathvika Ojo: Treating Benett Swoyer/Extender: Worthy Keeler Ngetich, Dinah Weeks in Treatment: 4 Edema Assessment Assessed: [Left: No] [Right: Yes] Edema: [Left: Ye] [Right: s] Calf Left: Right: Point of Measurement: 29 cm From Medial Instep 46.5 cm Ankle Left: Right: Point of Measurement: 9 cm From Medial Instep 30 cm Vascular Assessment Pulses: Dorsalis  Pedis Palpable: [Right:Yes] Electronic Signature(s) Signed: 01/27/2021 5:29:03 PM By: Deon Pilling Entered By: Deon Pilling on 01/27/2021 08:27:00 -------------------------------------------------------------------------------- Multi-Disciplinary Care Plan Details Patient Name: Date of Service: Tammy, Fitzpatrick 01/27/2021 8:15 A M Medical Record Number: [id number] Patient Account Number: [id number] Date of Birth/Sex: Treating RN: 1951-04-28 (70 y.o. Debby Bud Primary Care Omir Cooprider: Marlowe Sax Other Clinician: Referring Jewel Mcafee: Treating Mills Mitton/Extender: Tyron Russell, Dinah Weeks in Treatment: 4 Active Inactive Wound/Skin Impairment Nursing Diagnoses: Impaired tissue integrity Goals: Patient/caregiver will verbalize understanding of skin care regimen Date Initiated: 12/28/2020 Target Resolution Date: 02/19/2021 Goal Status:  Active Ulcer/skin breakdown will have a volume reduction of 30% by week 4 Date Initiated: 12/28/2020 Date Inactivated: 01/27/2021 Target Resolution Date: 01/25/2021 Goal Status: Met Interventions: Assess patient/caregiver ability to obtain necessary supplies Assess patient/caregiver ability to perform ulcer/skin care regimen upon admission and as needed Assess ulceration(s) every visit Provide education on ulcer and skin care Treatment Activities: Topical wound management initiated : 12/28/2020 Notes: Electronic Signature(s) Signed: 01/27/2021 5:29:03 PM By: Deon Pilling Entered By: Deon Pilling on 01/27/2021 08:30:58 -------------------------------------------------------------------------------- Pain Assessment Details Patient Name: Date of Service: Tammy, Fitzpatrick 01/27/2021 8:15 A M Medical Record Number: [id number] Patient Account Number: [id number] Date of Birth/Sex: Treating RN: 08/16/1950 (70 y.o. Debby Bud Primary Care Xavien Dauphinais: Marlowe Sax Other Clinician: Referring Jesstin Studstill: Treating Rini Moffit/Extender: Worthy Keeler Ngetich, Dinah Weeks in Treatment: 4 Active Problems Location of Pain Severity and Description of Pain Patient Has Paino No Site Locations Rate the pain. Rate the pain. Current Pain Level: 0 Pain Management and Medication Current Pain Management: Medication: No Cold Application: No Rest: No Massage: No Activity: No T.E.N.S.: No Heat Application: No Leg drop or elevation: No Is the Current Pain Management Adequate: Adequate How does your wound impact your activities of daily livingo Sleep: No Bathing: No Appetite: No Relationship With Others: No Bladder Continence: No Emotions: No Bowel Continence: No Work: No Toileting: No Drive: No Dressing: No Hobbies: No Electronic Signature(s) Signed: 01/27/2021 5:29:03 PM By: Deon Pilling Entered By: Deon Pilling on 01/27/2021 08:26:11 -------------------------------------------------------------------------------- Patient/Caregiver Education Details Patient Name: Date of Service: Tammy Fitzpatrick 9/7/2022andnbsp8:15 A M Medical Record Number: [id number] Patient Account Number: [id number] Date of Birth/Gender: Treating RN: [birth year] (70 y.o. Debby Bud Primary Care Physician: Marlowe Sax Other Clinician: Referring Physician: Treating Physician/Extender: Merlene Laughter in Treatment: 4 Education Assessment Education Provided To: Patient Education Topics Provided Wound/Skin Impairment: Handouts: Skin Care Do's and Dont's Methods: Explain/Verbal Responses: Reinforcements needed Electronic Signature(s) Signed: 01/27/2021 5:29:03 PM By: Deon Pilling Entered By: Deon Pilling on 01/27/2021 08:31:10 -------------------------------------------------------------------------------- Wound Assessment Details Patient Name: Date of Service: Tammy, Fitzpatrick 01/27/2021 [place] Record Number: [id number] Patient Account Number: [id number] Date of Birth/Sex: Treating  RN: Dec 31, 1950 (70 y.o. Helene Shoe, Meta.Reding Primary Care Gareth Fitzner: Marlowe Sax Other Clinician: Referring Daquon Greenleaf: Treating Zahriah Roes/Extender: Worthy Keeler Ngetich, Dinah Weeks in Treatment: 4 Wound Status Wound Number: 1 Primary Etiology: Lymphedema Wound Location: Right, Medial Lower Leg Wound Status: Open Wounding Event: Trauma Comorbid History: Hypertension, Osteoarthritis Date Acquired: 09/21/2020 Weeks Of Treatment: 4 Clustered Wound: No Photos Wound Measurements Length: (cm) 0.6 Width: (cm) 2 Depth: (cm) 0.6 Area: (cm) 0.942 Volume: (cm) 0.565 % Reduction in Area: 89.3% % Reduction in Volume: 78.7% Epithelialization: Small (1-33%) Tunneling: Yes Position (o'clock): 12 Maximum Distance: (cm) 1.4 Undermining: No Wound Description Classification: Full Thickness Without Exposed Support Structures Wound Margin: Distinct, outline attached Exudate Amount: Medium Exudate Type: Serosanguineous Exudate Color: red, brown Foul Odor After Cleansing: No Slough/Fibrino No Wound Bed Granulation Amount: Large (67-100%) Exposed Structure Granulation Quality: Red Fascia Exposed: No Necrotic Amount: None Present (0%) Fat Layer (Subcutaneous Tissue) Exposed: Yes Tendon Exposed: No Muscle Exposed: No Joint Exposed: No Bone Exposed: No Treatment Notes Wound #1 (Lower Leg) Wound Laterality: Right, Medial Cleanser Soap and Water Discharge Instruction: May shower and wash wound with dial antibacterial soap and water prior to dressing change. Wound Cleanser Discharge Instruction: Cleanse the wound with wound cleanser prior to applying a clean dressing using gauze sponges, not tissue or cotton  balls. Peri-Wound Care Sween Lotion (Moisturizing lotion) Discharge Instruction: Apply moisturizing lotion to leg Topical Primary Dressing Hydrofera Blue Ready Foam, 2.5 x2.5 in Discharge Instruction: Cut slightly smaller than tunnel Secondary Dressing Woven Gauze Sponge,  Non-Sterile 4x4 in Discharge Instruction: Apply over primary dressing as directed. Secured With Compression Wrap CoFlex TLC XL 2-layer Compression System 4x7 (in/yd) Discharge Instruction: Apply CoFlex 2-layer compression as directed. (alt for 4 layer) Compression Stockings Add-Ons Electronic Signature(s) Signed: 01/27/2021 5:29:03 PM By: Deon Pilling Entered By: Deon Pilling on 01/27/2021 08:29:05 -------------------------------------------------------------------------------- Vitals Details Patient Name: Date of Service: Tammy, Fitzpatrick 01/27/2021 8:15 A M Medical Record Number: [id number] Patient Account Number: [id number] Date of Birth/Sex: Treating RN: Oct 12, 1950 (70 y.o. Debby Bud Primary Care Elvera Almario: Marlowe Sax Other Clinician: Referring Gianlucas Evenson: Treating Megin Consalvo/Extender: Worthy Keeler Ngetich, Dinah Weeks in Treatment: 4 Vital Signs Time Taken: 08:25 Temperature (F): 98.8 Height (in): 69 Pulse (bpm): 92 Weight (lbs): 250 Respiratory Rate (breaths/min): 20 Body Mass Index (BMI): 36.9 Blood Pressure (mmHg): 143/90 Reference Range: 80 - 120 mg / dl Notes BP and Heart rate taken while sitting for several minutes. Electronic Signature(s) Signed: 01/27/2021 5:29:03 PM By: Deon Pilling Entered By: Deon Pilling on 01/27/2021 08:30:27

## 2021-01-27 NOTE — Progress Notes (Addendum)
Tammy Fitzpatrick, Tammy Fitzpatrick ([id number]) Visit Report for 9/7/Fitzpatrick Chief Complaint Document Details Patient Name: Date of Service: Tammy Fitzpatrick 9/7/Fitzpatrick 8:15 A M Medical Record Number: [id number] Patient Account Number: [id number] Date of Birth/Sex: Treating RN: Nov 17, 1950 (70 y.o. Tammy Fitzpatrick) Boehlein, Linda Primary Care Provider: Richarda BladeNgetich, Fitzpatrick Other Clinician: Referring Provider: Treating Provider/Extender: Tammy Fitzpatrick, Tammy Fitzpatrick Ngetich, Tammy Fitzpatrick: 4 Information Obtained from: Patient Chief Complaint Right leg ulcer following dog scratch Tammy Fitzpatrick Electronic Signature(s) Signed: 9/7/Fitzpatrick 8:24:24 AM By: Tammy Fitzpatrick, Vance Belcourt PA-C Entered By: Tammy Fitzpatrick, Nuh Lipton on 09/07/Fitzpatrick 40:10:2708:24:24 -------------------------------------------------------------------------------- HPI Details Patient Name: Date of Service: Tammy Fitzpatrick 9/7/Fitzpatrick 8:15 A M Medical Record Number: [id number] Patient Account Number: [id number] Date of Birth/Sex: Treating RN: Nov 17, 1950 (70 y.o. Tammy Fitzpatrick) Boehlein, Linda Primary Care Provider: Richarda BladeNgetich, Fitzpatrick Other Clinician: Referring Provider: Treating Provider/Extender: Tammy Fitzpatrick, Lavonne Cass Ngetich, Tammy Fitzpatrick: 4 History of Present Illness HPI Description: 8/8/Fitzpatrick upon evaluation today patient presents for initial evaluation here in our clinic concerning a wound that she has over the right medial lower leg. This is subsequently a result of initially a trauma from her dog where he jumped up on her and caused a laceration. With that being said unfortunately the patient has been having issues with this since that time trying to get this healed. Specifically the initial injury occurred to Tammy Fitzpatrick. The sutures did seem to be his around 7/18/Fitzpatrick she was placed on doxycycline. Following that time she has been using Xeroform and a border gauze dressing currently which again is keeping it covered but that is about it. With regard to medical history the patient does have what  appears to be an obvious history of chronic venous insufficiency and lymphedema. She also has high blood pressure noted today.. 8/17/Fitzpatrick upon evaluation today patient appears to be doing well with regard to her wound. This actually appears to be significantly improved as compared to last week. She has been tolerating the dressing changes. In fact there really does not appear to be anything Require sharp debridement today as she is truly doing so well. She does not necessarily like the compression wrap which to be honest I completely understand but nonetheless I do believe its been beneficial as well as far as getting this wound to heal appropriately. 8/24/Fitzpatrick upon evaluation today patient appears to be doing well with regard to her leg ulcer. She has been tolerating the dressing changes without complication. Hydrofera Blue was done excellent job up to this point. With that being said I do not believe that we are seeing any signs of worsening and in fact everything appears to be significantly improved compared to previous. 8/31/Fitzpatrick upon evaluation today patient appears to be doing much better in regard to her wound. This is actually showing signs of significant improvement which is also news. I do not see any evidence of active infection which is also great and in general I think that we are headed in the appropriate direction. 9/7/Fitzpatrick upon evaluation today patient appears to be doing well with regard to her wound. She has been tolerating the dressing changes without complication. Fortunately there does not appear to be any signs of active infection which is great news and overall I am extremely pleased with where we stand today. No fevers, chills, nausea, vomiting, or diarrhea. Electronic Signature(s) Signed: 9/7/Fitzpatrick 8:50:18 AM By: Tammy Fitzpatrick, Tammy Peery PA-C Previous Signature: 9/7/Fitzpatrick 8:31:51 AM Version By: Tammy Fitzpatrick, Haven Foss PA-C Previous Signature: 9/7/Fitzpatrick 8:31:51 AM Version By: Tammy Fitzpatrick, Akansha Wyche  PA-C  Previous Signature: 9/7/Fitzpatrick 8:30:38 AM Version By: Tammy Kelp PA-C Entered By: Tammy Kelp on 09/07/Fitzpatrick 08:50:18 -------------------------------------------------------------------------------- Physical Exam Details Patient Name: Date of Service: Tammy Fitzpatrick 9/7/Fitzpatrick 8:15 A M Medical Record Number: [id number] Patient Account Number: [id number] Date of Birth/Sex: Treating RN: 25-Sep-1950 (70 y.o. Tammy Fitzpatrick Primary Care Provider: [name] Other Clinician: Referring Provider: Treating Provider/Extender: Tammy Kelp Ngetich, Tammy Fitzpatrick: 4 Constitutional Well-nourished and well-hydrated in no acute distress. Respiratory normal breathing without difficulty. Psychiatric this patient is able to make decisions and demonstrates good insight into disease process. Alert and Oriented x 3. pleasant and cooperative. Notes Upon inspection patient's wound bed showed signs of good granulation epithelization at this point. Fortunately there does not appear to be any evidence of active infection which is great news and overall I am extremely pleased with where we stand today. I think she is definitely headed in the appropriate direction. Electronic Signature(s) Signed: 9/7/Fitzpatrick 8:50:35 AM By: Tammy Kelp PA-C Previous Signature: 9/7/Fitzpatrick 8:32:05 AM Version By: Tammy Kelp PA-C Previous Signature: 9/7/Fitzpatrick 8:30:54 AM Version By: Tammy Kelp PA-C Entered By: Tammy Kelp on 09/07/Fitzpatrick 08:50:35 -------------------------------------------------------------------------------- Physician Orders Details Patient Name: Date of Service: Tammy Fitzpatrick 9/7/Fitzpatrick 8:15 A M Medical Record Number: [id number] Patient Account Number: [id number] Date of Birth/Sex: Treating RN: 05-May-1951 (70 y.o. Tammy Fitzpatrick Primary Care Provider: [name] Other Clinician: Referring Provider: Treating Provider/Extender: Tammy Noe,  Tammy Fitzpatrick: 4 Verbal / Phone Orders: No Diagnosis Coding ICD-10 Coding Code Description I87.2 Venous insufficiency (chronic) (peripheral) I89.0 Lymphedema, not elsewhere classified L97.812 Non-pressure chronic ulcer of other part of right lower leg with fat layer exposed I10 Essential (primary) hypertension Follow-up Appointments ppointment in 1 week. - with Leonard Schwartz Return A Bathing/ Shower/ Hygiene May shower with protection but do not get wound dressing(s) wet. Edema Control - Lymphedema / SCD / Other Elevate legs to the level of the heart or above for 30 minutes daily and/or when sitting, a frequency of: Avoid standing for long periods of time. Exercise regularly Additional Orders / Instructions Follow Nutritious Diet Wound Fitzpatrick Wound #1 - Lower Leg Wound Laterality: Right, Medial Cleanser: Soap and Water 1 x Per Week/15 Days Discharge Instructions: May shower and wash wound with dial antibacterial soap and water prior to dressing change. Cleanser: Wound Cleanser 1 x Per Week/15 Days Discharge Instructions: Cleanse the wound with wound cleanser prior to applying a clean dressing using gauze sponges, not tissue or cotton balls. Peri-Wound Care: Sween Lotion (Moisturizing lotion) 1 x Per Week/15 Days Discharge Instructions: Apply moisturizing lotion to leg Prim Dressing: Hydrofera Blue Ready Foam, 2.5 x2.5 in 1 x Per Week/15 Days ary Discharge Instructions: Cut slightly smaller than tunnel Secondary Dressing: Woven Gauze Sponge, Non-Sterile 4x4 in 1 x Per Week/15 Days Discharge Instructions: Apply over primary dressing as directed. Compression Wrap: CoFlex TLC XL 2-layer Compression System 4x7 (in/yd) 1 x Per Week/15 Days Discharge Instructions: Apply CoFlex 2-layer compression as directed. (alt for 4 layer) Electronic Signature(s) Signed: 9/7/Fitzpatrick 6:00:14 PM By: Tammy Kelp PA-C Signed: 9/7/Fitzpatrick 6:09:03 PM By: Zenaida Deed RN, BSN Entered By: Zenaida Deed on 09/07/Fitzpatrick 08:48:58 -------------------------------------------------------------------------------- Problem List Details Patient Name: Date of Service: ALYANAH, ELLIOTT 9/7/Fitzpatrick 8:15 A M Medical Record Number: [id number] Patient Account Number: [id number] Date of Birth/Sex: Treating RN: [birth year] (70 y.o. Tammy Fitzpatrick Primary Care Provider: [name] Other Clinician: Referring Provider: Treating Provider/Extender: Tammy Kelp  Ngetich, Tammy Fitzpatrick: 4 Active Problems ICD-10 Encounter Code Description Active Date MDM Diagnosis I87.2 Venous insufficiency (chronic) (peripheral) 8/8/Fitzpatrick No Yes I89.0 Lymphedema, not elsewhere classified 8/8/Fitzpatrick No Yes L97.812 Non-pressure chronic ulcer of other part of right lower leg with fat layer 8/8/Fitzpatrick No Yes exposed I10 Essential (primary) hypertension 8/8/Fitzpatrick No Yes Inactive Problems Resolved Problems Electronic Signature(s) Signed: 9/7/Fitzpatrick 8:24:10 AM By: Tammy Kelp PA-C Entered By: Tammy Kelp on 09/07/Fitzpatrick 08:24:10 -------------------------------------------------------------------------------- Progress Note Details Patient Name: Date of Service: CANDANCE, BOHLMAN 9/7/Fitzpatrick 8:15 A M Medical Record Number: [id number] Patient Account Number: [id number] Date of Birth/Sex: Treating RN: [birth year] (70 y.o. Tammy Fitzpatrick Primary Care Provider: [name] Other Clinician: Referring Provider: Treating Provider/Extender: Sherlene Shams in Fitzpatrick: 4 Subjective Chief Complaint Information obtained from Patient Right leg ulcer following dog scratch Tammy Fitzpatrick History of Present Illness (HPI) 8/8/Fitzpatrick upon evaluation today patient presents for initial evaluation here in our clinic concerning a wound that she has over the right medial lower leg. This is subsequently a result of initially a trauma from her dog where he jumped up on her and caused a laceration. With  that being said unfortunately the patient has been having issues with this since that time trying to get this healed. Specifically the initial injury occurred to Tammy Fitzpatrick. The sutures did seem to be his around 7/18/Fitzpatrick she was placed on doxycycline. Following that time she has been using Xeroform and a border gauze dressing currently which again is keeping it covered but that is about it. With regard to medical history the patient does have what appears to be an obvious history of chronic venous insufficiency and lymphedema. She also has high blood pressure noted today.. 8/17/Fitzpatrick upon evaluation today patient appears to be doing well with regard to her wound. This actually appears to be significantly improved as compared to last week. She has been tolerating the dressing changes. In fact there really does not appear to be anything Require sharp debridement today as she is truly doing so well. She does not necessarily like the compression wrap which to be honest I completely understand but nonetheless I do believe its been beneficial as well as far as getting this wound to heal appropriately. 8/24/Fitzpatrick upon evaluation today patient appears to be doing well with regard to her leg ulcer. She has been tolerating the dressing changes without complication. Hydrofera Blue was done excellent job up to this point. With that being said I do not believe that we are seeing any signs of worsening and in fact everything appears to be significantly improved compared to previous. 8/31/Fitzpatrick upon evaluation today patient appears to be doing much better in regard to her wound. This is actually showing signs of significant improvement which is also news. I do not see any evidence of active infection which is also great and in general I think that we are headed in the appropriate direction. 9/7/Fitzpatrick upon evaluation today patient appears to be doing well with regard to her wound. She has been tolerating the dressing  changes without complication. Fortunately there does not appear to be any signs of active infection which is great news and overall I am extremely pleased with where we stand today. No fevers, chills, nausea, vomiting, or diarrhea. Objective Constitutional Well-nourished and well-hydrated in no acute distress. Vitals Time Taken: 8:25 AM, Height: 69 in, Weight: 250 lbs, BMI: 36.9, Temperature: 98.8 F, Pulse: 92 bpm, Respiratory Rate: 20 breaths/min, Blood  Pressure: 143/90 mmHg. General Notes: BP and Heart rate taken while sitting for several minutes. Respiratory normal breathing without difficulty. Psychiatric this patient is able to make decisions and demonstrates good insight into disease process. Alert and Oriented x 3. pleasant and cooperative. General Notes: Upon inspection patient's wound bed showed signs of good granulation epithelization at this point. Fortunately there does not appear to be any evidence of active infection which is great news and overall I am extremely pleased with where we stand today. I think she is definitely headed in the appropriate direction. Integumentary (Hair, Skin) Wound #1 status is Open. Original cause of wound was Trauma. The date acquired was: 5/2/Fitzpatrick. The wound has been in Fitzpatrick 4 weeks. The wound is located on the Right,Medial Lower Leg. The wound measures 0.6cm length x 2cm width x 0.6cm depth; 0.942cm^2 area and 0.565cm^3 volume. There is Fat Layer (Subcutaneous Tissue) exposed. There is no undermining noted, however, there is tunneling at 12:00 with a maximum distance of 1.4cm. There is a medium amount of serosanguineous drainage noted. The wound margin is distinct with the outline attached to the wound base. There is large (67-100%) red granulation within the wound bed. There is no necrotic tissue within the wound bed. Assessment Active Problems ICD-10 Venous insufficiency (chronic) (peripheral) Lymphedema, not elsewhere  classified Non-pressure chronic ulcer of other part of right lower leg with fat layer exposed Essential (primary) hypertension Procedures Wound #1 Pre-procedure diagnosis of Wound #1 is a Lymphedema located on the Right,Medial Lower Leg . There was a Double Layer Compression Therapy Procedure by Zenaida Deed, RN. Post procedure Diagnosis Wound #1: Same as Pre-Procedure Plan Follow-up Appointments: Return Appointment in 1 week. - with Luana Shu Shower/ Hygiene: May shower with protection but do not get wound dressing(s) wet. Edema Control - Lymphedema / SCD / Other: Elevate legs to the level of the heart or above for 30 minutes daily and/or when sitting, a frequency of: Avoid standing for long periods of time. Exercise regularly Additional Orders / Instructions: Follow Nutritious Diet WOUND #1: - Lower Leg Wound Laterality: Right, Medial Cleanser: Soap and Water 1 x Per Week/15 Days Discharge Instructions: May shower and wash wound with dial antibacterial soap and water prior to dressing change. Cleanser: Wound Cleanser 1 x Per Week/15 Days Discharge Instructions: Cleanse the wound with wound cleanser prior to applying a clean dressing using gauze sponges, not tissue or cotton balls. Peri-Wound Care: Sween Lotion (Moisturizing lotion) 1 x Per Week/15 Days Discharge Instructions: Apply moisturizing lotion to leg Prim Dressing: Hydrofera Blue Ready Foam, 2.5 x2.5 in 1 x Per Week/15 Days ary Discharge Instructions: Cut slightly smaller than tunnel Secondary Dressing: Woven Gauze Sponge, Non-Sterile 4x4 in 1 x Per Week/15 Days Discharge Instructions: Apply over primary dressing as directed. Com pression Wrap: CoFlex TLC XL 2-layer Compression System 4x7 (in/yd) 1 x Per Week/15 Days Discharge Instructions: Apply CoFlex 2-layer compression as directed. (alt for 4 layer) 1. Would recommend currently that we going continue with the wound care measures as before and the patient is in  agreement with the plan. This includes the use of the Hydrofera Blue dressing tucked into the tunnel as well as laid over top this seems to be doing awesome. 2. I am also going to recommend we continue with the Coflex 2 layer compression wrap. 3. I am also can recommend the patient continue to elevate her legs is much as possible. We will see patient back for reevaluation in 1 week here in the  clinic. If anything worsens or changes patient will contact our office for additional recommendations. Electronic Signature(s) Signed: 9/7/Fitzpatrick 8:51:10 AM By: Tammy Kelp PA-C Entered By: Tammy Kelp on 09/07/Fitzpatrick 08:51:10 -------------------------------------------------------------------------------- SuperBill Details Patient Name: Date of Service: HILLARY, STRUSS 9/7/Fitzpatrick Medical Record Number: [id number] Patient Account Number: [id number] Date of Birth/Sex: Treating RN: September 16, 1950 (70 y.o. Tammy Fitzpatrick Primary Care Provider: [name] Other Clinician: Referring Provider: Treating Provider/Extender: Tammy Noe, Tammy Fitzpatrick: 4 Diagnosis Coding ICD-10 Codes Code Description I87.2 Venous insufficiency (chronic) (peripheral) I89.0 Lymphedema, not elsewhere classified L97.812 Non-pressure chronic ulcer of other part of right lower leg with fat layer exposed I10 Essential (primary) hypertension Facility Procedures CPT4 Code: [id number] Description: (Facility Use Only) [phone] - APPLY MULTLAY COMPRS LWR RT LEG Modifier: Quantity: 1 Physician Procedures : CPT4 Code Description Modifier [id number] - WC PHYS LEVEL 3 - EST PT ICD-10 Diagnosis Description I87.2 Venous insufficiency (chronic) (peripheral) I89.0 Lymphedema, not elsewhere classified L97.812 Non-pressure chronic ulcer of other part of right  lower leg with fat layer exposed I10 Essential (primary) hypertension Quantity: 1 Electronic Signature(s) Signed: 9/7/Fitzpatrick 8:51:23 AM By: Tammy Kelp PA-C Entered By: Tammy Kelp on 09/07/Fitzpatrick 08:51:23

## 2021-01-28 ENCOUNTER — Encounter: Payer: Self-pay | Admitting: Family

## 2021-01-28 ENCOUNTER — Ambulatory Visit (INDEPENDENT_AMBULATORY_CARE_PROVIDER_SITE_OTHER): Payer: Medicare Other | Admitting: Family

## 2021-01-28 VITALS — BP 160/88 | HR 89 | Temp 97.7°F | Resp 18 | Ht 69.0 in | Wt 292.4 lb

## 2021-01-28 DIAGNOSIS — E785 Hyperlipidemia, unspecified: Secondary | ICD-10-CM

## 2021-01-28 DIAGNOSIS — I10 Essential (primary) hypertension: Secondary | ICD-10-CM

## 2021-01-28 DIAGNOSIS — Z23 Encounter for immunization: Secondary | ICD-10-CM

## 2021-01-28 NOTE — Progress Notes (Signed)
Provider: [name] FNP-C  Deseree Zemaitis, [name], NP  Patient Care Team: Tascha Casares, [name], NP as PCP - General (Family Medicine)  Extended Emergency Contact Information Primary Emergency Contact: Suthers,frankie Address: [address]          [place], [place] [address] [place] of [place] Home Phone: [phone] Work Phone: [phone] Mobile Phone: [phone] Relation: Daughter Secondary Emergency Contact: Dearing,[name] Home Phone: [phone] Mobile Phone: [phone] Relation: Son  Code Status:  DNR Goals of care: Advanced Directive information Advanced Directives 01/28/2021  Does Patient Have a Medical Advance Directive? No  Would patient like information on creating a medical advance directive? No - Patient declined     Chief Complaint  Patient presents with   Follow-up    2 week follow up.    HPI:  Pt is a 70 y.o. female seen today for an acute visit for evaluation of high blood pressure evaluation.she was here 01/12/2021 to establish care with provider.Her B/p was 160/120 and HR 114 b/min she was started on losartan 25 mg tablet daily  Has been taking losartan as directed.Home blood pressure readings ranging in the 120's/70's - 130's/80 and HR 70's -80's.states tends to be anxious when she comes to the office. She denies any headache,dizziness,vision changes,fatigue,chest tightness,palpitation,chest pain or shortness of breath. Has started on calories count diet that is delivered. Has had a weight loss 15.6 lbs since last visit.        Past Medical History:  Diagnosis Date   Arthritis    History of colonoscopy 2020   Hypertension    No past surgical history on file.  Allergies  Allergen Reactions   Other Hives and Rash    Poison Ivy    Outpatient Encounter Medications as of 01/28/2021  Medication Sig   losartan (COZAAR) 25 MG tablet TAKE 1 TABLET(25 MG) BY MOUTH DAILY   Multiple Vitamin (MULTIVITAMIN WITH MINERALS) TABS tablet Take 1 tablet by  mouth daily.   OPTIFIBER LEAN PO Take by mouth.   Pseudoephedrine-Ibuprofen (ADVIL COLD/SINUS PO) Take 1 tablet by mouth daily as needed (headache).   No facility-administered encounter medications on file as of 01/28/2021.    Review of Systems  Constitutional:  Negative for appetite change, chills, fatigue, fever and unexpected weight change.  HENT:  Negative for congestion, dental problem, ear discharge, ear pain, facial swelling, hearing loss, nosebleeds, postnasal drip, rhinorrhea, sinus pressure, sinus pain, sneezing, sore throat, tinnitus and trouble swallowing.   Eyes:  Negative for pain, discharge, redness, itching and visual disturbance.  Respiratory:  Negative for cough, chest tightness, shortness of breath and wheezing.   Cardiovascular:  Negative for chest pain, palpitations and leg swelling.  Gastrointestinal:  Negative for abdominal distention, abdominal pain, blood in stool, constipation, diarrhea, nausea and vomiting.  Endocrine: Negative for cold intolerance, heat intolerance, polydipsia, polyphagia and polyuria.  Genitourinary:  Negative for difficulty urinating, dysuria, flank pain, frequency and urgency.  Musculoskeletal:  Negative for arthralgias, back pain, gait problem, joint swelling, myalgias, neck pain and neck stiffness.  Skin:  Positive for wound. Negative for color change, pallor and rash.       Right leg wound ulna boot follows up with wound care Center has appointment 02/03/2021  Neurological:  Negative for dizziness, syncope, speech difficulty, weakness, light-headedness, numbness and headaches.  Hematological:  Does not bruise/bleed easily.  Psychiatric/Behavioral:  Negative for agitation, behavioral problems, confusion, hallucinations, self-injury, sleep disturbance and suicidal ideas. The patient is not nervous/anxious.    Immunization History  Administered Date(s) Administered   PFIZER(Purple Top)SARS-COV-2 Vaccination 06/15/2019, 07/06/2019, 05/19/2020    Pneumococcal Polysaccharide-23 01/12/2021   Tdap 11/16/2020   Pertinent  Health Maintenance Due  Topic Date Due   COLONOSCOPY (Pts 45-53yrs Insurance coverage will need to be confirmed)  Never done   MAMMOGRAM  Never done   DEXA SCAN  Never done   INFLUENZA VACCINE  Never done   PNA vac Low Risk Adult (2 of 2 - PCV13) 01/12/2022   Fall Risk  01/28/2021 01/12/2021  Falls in the past year? 0 0  Number falls in past yr: 0 0  Injury with Fall? 0 0  Risk for fall due to : No Fall Risks No Fall Risks  Follow up Falls evaluation completed Falls evaluation completed   Functional Status Survey:    Vitals:   01/28/21 1031 01/28/21 1055  BP: (!) 160/88   Pulse: (!) 122 89  Resp: 18   Temp: 97.7 F (36.5 C)   SpO2: 98%   Weight: 292 lb 6.4 oz (132.6 kg)   Height: 5\' 9"  (1.753 m)    Body mass index is 43.18 kg/m. Physical Exam Vitals reviewed.  Constitutional:      General: She is not in acute distress.    Appearance: Normal appearance. She is morbidly obese. She is not ill-appearing or diaphoretic.  HENT:     Head: Normocephalic.  Eyes:     General: No scleral icterus.       Right eye: No discharge.        Left eye: No discharge.     Conjunctiva/sclera: Conjunctivae normal.     Pupils: Pupils are equal, round, and reactive to light.  Neck:     Vascular: No carotid bruit.  Cardiovascular:     Rate and Rhythm: Normal rate and regular rhythm.     Pulses: Normal pulses.     Heart sounds: Normal heart sounds. No murmur heard.   No friction rub. No gallop.  Pulmonary:     Effort: Pulmonary effort is normal. No respiratory distress.     Breath sounds: Normal breath sounds. No wheezing, rhonchi or rales.  Chest:     Chest wall: No tenderness.  Abdominal:     General: Bowel sounds are normal. There is no distension.     Palpations: Abdomen is soft. There is no mass.     Tenderness: There is no abdominal tenderness. There is no right CVA tenderness, left CVA tenderness, guarding  or rebound.  Musculoskeletal:        General: No swelling or tenderness. Normal range of motion.     Cervical back: Normal range of motion. No rigidity or tenderness.     Right lower leg: No edema.     Left lower leg: No edema.  Lymphadenopathy:     Cervical: No cervical adenopathy.  Skin:    General: Skin is warm and dry.     Coloration: Skin is not pale.     Findings: No erythema.     Comments: Right wound dressing dry and clean.  Neurological:     Mental Status: She is alert and oriented to person, place, and time.     Cranial Nerves: No cranial nerve deficit.     Sensory: No sensory deficit.     Motor: No weakness.     Coordination: Coordination normal.     Gait: Gait normal.  Psychiatric:        Mood and Affect: Mood normal.  Speech: Speech normal.        Behavior: Behavior normal.        Thought Content: Thought content normal.        Judgment: Judgment normal.    Labs reviewed: Recent Labs    01/12/21 1504  NA 140  K 3.9  CL 105  CO2 25  GLUCOSE 114*  BUN 17  CREATININE 1.04*  CALCIUM 9.4   Recent Labs    01/12/21 1504  AST 12  ALT 10  BILITOT 0.5  PROT 7.3   Recent Labs    01/12/21 1504  WBC 5.0  NEUTROABS 2,730  HGB 13.7  HCT 42.3  MCV 88.9  PLT 261   Lab Results  Component Value Date   TSH 2.99 01/12/2021   Lab Results  Component Value Date   HGBA1C 5.6 01/12/2021   Lab Results  Component Value Date   CHOL 242 (H) 01/12/2021   HDL 79 01/12/2021   LDLCALC 140 (H) 01/12/2021   TRIG 117 01/12/2021   CHOLHDL 3.1 01/12/2021    Significant Diagnostic Results in last 30 days:  No results found.  Assessment/Plan 1. Need for influenza vaccination Afebrile. Asymptomatic  Flu vaccine administered by Jasmine Dillard,CMA  - Flu Vaccine QUAD High Dose(Fluad)  2. Essential hypertension Home blood pressure readings are within normal range 120's/70's -130's/80's with HR in the 70's-80's.B/p readings at the office tends to be higher  due anxiety ? White coat syndrome.will go by her home blood pressure readings. - Advised to check Blood pressure at home and record on log provided and notify provider if B/p > 140/90  - continue on Losartan 25 mg tablet daily will increase to 50 mg tablet daily if B/p > 140/90   3. Hyperlipidemia LDL goal <100 LDL 140  Has started modifying her diet since last visit has lost 16 lbs since last previous visit 2 weeks ago.  - Dietary modification and exercise at least 3 times per week for 30 minutes advised. - additional DASH Eating plan Education information provided on AVS   Family/ staff Communication: Reviewed plan of care with patient verbalized understanding  Labs/tests ordered: None   Next Appointment: 4 months for medical management of chronic issues.  Caesar Bookman, NP

## 2021-02-03 ENCOUNTER — Encounter (HOSPITAL_BASED_OUTPATIENT_CLINIC_OR_DEPARTMENT_OTHER): Payer: Medicare Other | Admitting: Physician Assistant

## 2021-02-03 ENCOUNTER — Other Ambulatory Visit: Payer: Self-pay

## 2021-02-03 DIAGNOSIS — L97812 Non-pressure chronic ulcer of other part of right lower leg with fat layer exposed: Secondary | ICD-10-CM | POA: Diagnosis not present

## 2021-02-03 NOTE — Progress Notes (Addendum)
Tammy Fitzpatrick, Tammy Fitzpatrick ([id number]) Visit Report for 02/03/2021 Chief Complaint Document Details Patient Name: Date of Service: Tammy Fitzpatrick, Tammy Fitzpatrick 02/03/2021 8:00 A M Medical Record Number: [id number] Patient Account Number: [id number] Date of Birth/Sex: Treating RN: Oct 21, 1950 (70 y.o. [name] Primary Care Provider: [name] Other Clinician: Referring Provider: Treating Provider/Extender: Erie Noe, Dinah Weeks in Treatment: 5 Information Obtained from: Patient Chief Complaint Right leg ulcer following dog scratch June 2022 Electronic Signature(s) Signed: 02/03/2021 8:42:21 AM By: Lenda Kelp PA-C Entered By: Lenda Kelp on 02/03/2021 08:42:21 -------------------------------------------------------------------------------- HPI Details Patient Name: Date of Service: Tammy Fitzpatrick, Tammy Fitzpatrick 02/03/2021 8:00 A M Medical Record Number: [id number] Patient Account Number: [id number] Date of Birth/Sex: Treating RN: Mar 13, 1951 (70 y.o. [name] Primary Care Provider: [name] Other Clinician: Referring Provider: Treating Provider/Extender: Erie Noe, Dinah Weeks in Treatment: 5 History of Present Illness HPI Description: 12/28/2020 upon evaluation today patient presents for initial evaluation here in our clinic concerning a wound that she has over the right medial lower leg. This is subsequently a result of initially a trauma from her dog where he jumped up on her and caused a laceration. With that being said unfortunately the patient has been having issues with this since that time trying to get this healed. Specifically the initial injury occurred to November 16, 2020. The sutures did seem to be his around 12/07/2020 she was placed on doxycycline. Following that time she has been using Xeroform and a border gauze dressing currently which again is keeping it covered but that is about it. With regard to medical history the patient does have what  appears to be an obvious history of chronic venous insufficiency and lymphedema. She also has high blood pressure noted today.. 01/06/2021 upon evaluation today patient appears to be doing well with regard to her wound. This actually appears to be significantly improved as compared to last week. She has been tolerating the dressing changes. In fact there really does not appear to be anything Require sharp debridement today as she is truly doing so well. She does not necessarily like the compression wrap which to be honest I completely understand but nonetheless I do believe its been beneficial as well as far as getting this wound to heal appropriately. 01/13/2021 upon evaluation today patient appears to be doing well with regard to her leg ulcer. She has been tolerating the dressing changes without complication. Hydrofera Blue was done excellent job up to this point. With that being said I do not believe that we are seeing any signs of worsening and in fact everything appears to be significantly improved compared to previous. 01/20/2021 upon evaluation today patient appears to be doing much better in regard to her wound. This is actually showing signs of significant improvement which is also news. I do not see any evidence of active infection which is also great and in general I think that we are headed in the appropriate direction. 01/27/2021 upon evaluation today patient appears to be doing well with regard to her wound. She has been tolerating the dressing changes without complication. Fortunately there does not appear to be any signs of active infection which is great news and overall I am extremely pleased with where we stand today. No fevers, chills, nausea, vomiting, or diarrhea. 02/03/2021 upon evaluation today patient appears to be doing well with regard to her wound. She has been tolerating the dressing changes without complication. Fortunately there does not appear to be any evidence of  active  infection which is great news and overall I am extremely pleased with where we stand today. No fevers, chills, nausea, vomiting, or diarrhea. Electronic Signature(s) Signed: 02/03/2021 8:48:17 AM By: Lenda Kelp PA-C Entered By: Lenda Kelp on 02/03/2021 08:48:17 -------------------------------------------------------------------------------- Physical Exam Details Patient Name: Date of Service: Tammy Fitzpatrick, Tammy Fitzpatrick 02/03/2021 8:00 A M Medical Record Number: [id number] Patient Account Number: [id number] Date of Birth/Sex: Treating RN: August 27, 1950 (70 y.o. [name] Primary Care Provider: [name] Other Clinician: Referring Provider: Treating Provider/Extender: Lenda Kelp Ngetich, Dinah Weeks in Treatment: 5 Constitutional Well-nourished and well-hydrated in no acute distress. Respiratory normal breathing without difficulty. Psychiatric this patient is able to make decisions and demonstrates good insight into disease process. Alert and Oriented x 3. pleasant and cooperative. Notes Upon inspection patient's wound bed showed signs of good granulation and epithelization at this point. Fortunately there does not appear to be any evidence of active infection systemically which is great news and overall I am extremely pleased with where things stand at this point. Electronic Signature(s) Signed: 02/03/2021 8:48:40 AM By: Lenda Kelp PA-C Entered By: Lenda Kelp on 02/03/2021 08:48:39 -------------------------------------------------------------------------------- Physician Orders Details Patient Name: Date of Service: Tammy Fitzpatrick, Tammy Fitzpatrick 02/03/2021 8:00 A M Medical Record Number: [id number] Patient Account Number: [id number] Date of Birth/Sex: Treating RN: 1950-07-19 (70 y.o. [name] Primary Care Provider: [name] Other Clinician: Referring Provider: Treating Provider/Extender: Erie Noe, Dinah Weeks in Treatment: 5 Verbal /  Phone Orders: No Diagnosis Coding ICD-10 Coding Code Description I87.2 Venous insufficiency (chronic) (peripheral) I89.0 Lymphedema, not elsewhere classified L97.812 Non-pressure chronic ulcer of other part of right lower leg with fat layer exposed I10 Essential (primary) hypertension Follow-up Appointments ppointment in 1 week. - with Leonard Schwartz Return A Bathing/ Shower/ Hygiene May shower with protection but do not get wound dressing(s) wet. Edema Control - Lymphedema / SCD / Other Elevate legs to the level of the heart or above for 30 minutes daily and/or when sitting, a frequency of: Avoid standing for long periods of time. Exercise regularly Additional Orders / Instructions Follow Nutritious Diet Wound Treatment Wound #1 - Lower Leg Wound Laterality: Right, Medial Cleanser: Soap and Water 1 x Per Week/15 Days Discharge Instructions: May shower and wash wound with dial antibacterial soap and water prior to dressing change. Cleanser: Wound Cleanser 1 x Per Week/15 Days Discharge Instructions: Cleanse the wound with wound cleanser prior to applying a clean dressing using gauze sponges, not tissue or cotton balls. Peri-Wound Care: Sween Lotion (Moisturizing lotion) 1 x Per Week/15 Days Discharge Instructions: Apply moisturizing lotion to leg Prim Dressing: Hydrofera Blue Ready Foam, 2.5 x2.5 in 1 x Per Week/15 Days ary Discharge Instructions: Cut slightly smaller than tunnel Secondary Dressing: Woven Gauze Sponge, Non-Sterile 4x4 in 1 x Per Week/15 Days Discharge Instructions: Apply over primary dressing as directed. Compression Wrap: CoFlex TLC XL 2-layer Compression System 4x7 (in/yd) 1 x Per Week/15 Days Discharge Instructions: Apply CoFlex 2-layer compression as directed. (alt for 4 layer) Electronic Signature(s) Signed: 02/03/2021 5:28:57 PM By: Lenda Kelp PA-C Signed: 02/03/2021 6:35:12 PM By: Zenaida Deed RN, BSN Entered By: Zenaida Deed on 02/03/2021  08:44:48 -------------------------------------------------------------------------------- Problem List Details Patient Name: Date of Service: Tammy Fitzpatrick, Tammy Fitzpatrick 02/03/2021 8:00 A M Medical Record Number: [id number] Patient Account Number: [id number] Date of Birth/Sex: Treating RN: 11-16-1950 (70 y.o. [name] Primary Care Provider: [name] Other Clinician: Referring Provider: Treating Provider/Extender: Erie Noe, Dinah Weeks  in Treatment: 5 Active Problems ICD-10 Encounter Code Description Active Date MDM Diagnosis I87.2 Venous insufficiency (chronic) (peripheral) 12/28/2020 No Yes I89.0 Lymphedema, not elsewhere classified 12/28/2020 No Yes L97.812 Non-pressure chronic ulcer of other part of right lower leg with fat layer 12/28/2020 No Yes exposed I10 Essential (primary) hypertension 12/28/2020 No Yes Inactive Problems Resolved Problems Electronic Signature(s) Signed: 02/03/2021 8:42:15 AM By: Lenda Kelp PA-C Entered By: Lenda Kelp on 02/03/2021 08:42:15 -------------------------------------------------------------------------------- Progress Note Details Patient Name: Date of Service: Tammy Fitzpatrick, Tammy Fitzpatrick 02/03/2021 8:00 A M Medical Record Number: [id number] Patient Account Number: [id number] Date of Birth/Sex: Treating RN: July 05, 1950 (70 y.o. [name] Primary Care Provider: [name] Other Clinician: Referring Provider: Treating Provider/Extender: Erie Noe, Dinah Weeks in Treatment: 5 Subjective Chief Complaint Information obtained from Patient Right leg ulcer following dog scratch June 2022 History of Present Illness (HPI) 12/28/2020 upon evaluation today patient presents for initial evaluation here in our clinic concerning a wound that she has over the right medial lower leg. This is subsequently a result of initially a trauma from her dog where he jumped up on her and caused a laceration. With that being said  unfortunately the patient has been having issues with this since that time trying to get this healed. Specifically the initial injury occurred to November 16, 2020. The sutures did seem to be his around 12/07/2020 she was placed on doxycycline. Following that time she has been using Xeroform and a border gauze dressing currently which again is keeping it covered but that is about it. With regard to medical history the patient does have what appears to be an obvious history of chronic venous insufficiency and lymphedema. She also has high blood pressure noted today.. 01/06/2021 upon evaluation today patient appears to be doing well with regard to her wound. This actually appears to be significantly improved as compared to last week. She has been tolerating the dressing changes. In fact there really does not appear to be anything Require sharp debridement today as she is truly doing so well. She does not necessarily like the compression wrap which to be honest I completely understand but nonetheless I do believe its been beneficial as well as far as getting this wound to heal appropriately. 01/13/2021 upon evaluation today patient appears to be doing well with regard to her leg ulcer. She has been tolerating the dressing changes without complication. Hydrofera Blue was done excellent job up to this point. With that being said I do not believe that we are seeing any signs of worsening and in fact everything appears to be significantly improved compared to previous. 01/20/2021 upon evaluation today patient appears to be doing much better in regard to her wound. This is actually showing signs of significant improvement which is also news. I do not see any evidence of active infection which is also great and in general I think that we are headed in the appropriate direction. 01/27/2021 upon evaluation today patient appears to be doing well with regard to her wound. She has been tolerating the dressing changes without  complication. Fortunately there does not appear to be any signs of active infection which is great news and overall I am extremely pleased with where we stand today. No fevers, chills, nausea, vomiting, or diarrhea. 02/03/2021 upon evaluation today patient appears to be doing well with regard to her wound. She has been tolerating the dressing changes without complication. Fortunately there does not appear to be any evidence of active infection  which is great news and overall I am extremely pleased with where we stand today. No fevers, chills, nausea, vomiting, or diarrhea. Objective Constitutional Well-nourished and well-hydrated in no acute distress. Vitals Time Taken: 8:06 AM, Height: 69 in, Source: Stated, Weight: 250 lbs, Source: Stated, BMI: 36.9, Temperature: 98.3 F, Pulse: 134 bpm, Respiratory Rate: 20 breaths/min, Blood Pressure: 153/88 mmHg. Respiratory normal breathing without difficulty. Psychiatric this patient is able to make decisions and demonstrates good insight into disease process. Alert and Oriented x 3. pleasant and cooperative. General Notes: Upon inspection patient's wound bed showed signs of good granulation and epithelization at this point. Fortunately there does not appear to be any evidence of active infection systemically which is great news and overall I am extremely pleased with where things stand at this point. Integumentary (Hair, Skin) Wound #1 status is Open. Original cause of wound was Trauma. The date acquired was: 09/21/2020. The wound has been in treatment 5 weeks. The wound is located on the Right,Medial Lower Leg. The wound measures 0.4cm length x 0.7cm width x 0.5cm depth; 0.22cm^2 area and 0.11cm^3 volume. There is Fat Layer (Subcutaneous Tissue) exposed. There is no undermining noted, however, there is tunneling at 12:00 with a maximum distance of 1.1cm. There is a small amount of serosanguineous drainage noted. The wound margin is distinct with the  outline attached to the wound base. There is large (67-100%) red granulation within the wound bed. There is no necrotic tissue within the wound bed. Assessment Active Problems ICD-10 Venous insufficiency (chronic) (peripheral) Lymphedema, not elsewhere classified Non-pressure chronic ulcer of other part of right lower leg with fat layer exposed Essential (primary) hypertension Procedures Wound #1 Pre-procedure diagnosis of Wound #1 is a Lymphedema located on the Right,Medial Lower Leg . There was a Double Layer Compression Therapy Procedure by Zenaida Deed, RN. Post procedure Diagnosis Wound #1: Same as Pre-Procedure Plan Follow-up Appointments: Return Appointment in 1 week. - with Luana Shu Shower/ Hygiene: May shower with protection but do not get wound dressing(s) wet. Edema Control - Lymphedema / SCD / Other: Elevate legs to the level of the heart or above for 30 minutes daily and/or when sitting, a frequency of: Avoid standing for long periods of time. Exercise regularly Additional Orders / Instructions: Follow Nutritious Diet WOUND #1: - Lower Leg Wound Laterality: Right, Medial Cleanser: Soap and Water 1 x Per Week/15 Days Discharge Instructions: May shower and wash wound with dial antibacterial soap and water prior to dressing change. Cleanser: Wound Cleanser 1 x Per Week/15 Days Discharge Instructions: Cleanse the wound with wound cleanser prior to applying a clean dressing using gauze sponges, not tissue or cotton balls. Peri-Wound Care: Sween Lotion (Moisturizing lotion) 1 x Per Week/15 Days Discharge Instructions: Apply moisturizing lotion to leg Prim Dressing: Hydrofera Blue Ready Foam, 2.5 x2.5 in 1 x Per Week/15 Days ary Discharge Instructions: Cut slightly smaller than tunnel Secondary Dressing: Woven Gauze Sponge, Non-Sterile 4x4 in 1 x Per Week/15 Days Discharge Instructions: Apply over primary dressing as directed. Com pression Wrap: CoFlex TLC XL  2-layer Compression System 4x7 (in/yd) 1 x Per Week/15 Days Discharge Instructions: Apply CoFlex 2-layer compression as directed. (alt for 4 layer) 1. Would recommend currently that we going continue with the wound care measures as before and the patient is in agreement with the plan. This includes the use of the Hydrofera Blue just a small piece to tucked into the wound bed I think this is appropriate. 2. I am also can recommend that  we have the patient continue with the Coflex 2 layer compression wrap for a 4-layer layer compression overall and I think that the patient is doing quite well in that regard. We will see patient back for reevaluation in 1 week here in the clinic. If anything worsens or changes patient will contact our office for additional recommendations. Electronic Signature(s) Signed: 02/03/2021 8:49:17 AM By: Lenda Kelp PA-C Entered By: Lenda Kelp on 02/03/2021 08:49:16 -------------------------------------------------------------------------------- SuperBill Details Patient Name: Date of Service: Tammy Fitzpatrick, Tammy Fitzpatrick 02/03/2021 Medical Record Number: [id number] Patient Account Number: [id number] Date of Birth/Sex: Treating RN: 1951/01/22 (70 y.o. [name] Primary Care Provider: [name] Other Clinician: Referring Provider: Treating Provider/Extender: Erie Noe, Dinah Weeks in Treatment: 5 Diagnosis Coding ICD-10 Codes Code Description I87.2 Venous insufficiency (chronic) (peripheral) I89.0 Lymphedema, not elsewhere classified L97.812 Non-pressure chronic ulcer of other part of right lower leg with fat layer exposed I10 Essential (primary) hypertension Facility Procedures CPT4 Code: [id number] Description: (Facility Use Only) [phone] - APPLY MULTLAY COMPRS LWR RT LEG Modifier: Quantity: 1 Physician Procedures : CPT4 Code Description Modifier [id number] - WC PHYS LEVEL 3 - EST PT ICD-10 Diagnosis Description I87.2 Venous  insufficiency (chronic) (peripheral) I89.0 Lymphedema, not elsewhere classified L97.812 Non-pressure chronic ulcer of other part of right  lower leg with fat layer exposed I10 Essential (primary) hypertension Quantity: 1 Electronic Signature(s) Signed: 02/03/2021 8:59:41 AM By: Lenda Kelp PA-C Entered By: Lenda Kelp on 02/03/2021 08:59:41

## 2021-02-03 NOTE — Progress Notes (Signed)
Tammy Fitzpatrick, Tammy Fitzpatrick ([id number]) Visit Report for 02/03/2021 Arrival Information Details Patient Name: Date of Service: Tammy Fitzpatrick, Tammy Fitzpatrick 02/03/2021 8:00 A M Medical Record Number: [id number] Patient Account Number: [id number] Date of Birth/Sex: Treating RN: [birth year] (70 y.o. Elam Dutch Primary Care Marleen Moret: Marlowe Sax Other Clinician: Referring Gee Habig: Treating Brandin Stetzer/Extender: Tyron Russell, Dinah Weeks in Treatment: 5 Visit Information History Since Last Visit Added or deleted any medications: No Patient Arrived: Ambulatory Any new allergies or adverse reactions: No Arrival Time: 08:08 Had a fall or experienced change in No Accompanied By: self activities of daily living that may affect Transfer Assistance: None risk of falls: Patient Identification Verified: Yes Signs or symptoms of abuse/neglect since last visito No Secondary Verification Process Completed: Yes Hospitalized since last visit: No Patient Requires Transmission-Based Precautions: No Implantable device outside of the clinic excluding No Patient Has Alerts: No cellular tissue based products placed in the center since last visit: Has Dressing in Place as Prescribed: Yes Has Compression in Place as Prescribed: Yes Pain Present Now: No Electronic Signature(s) Signed: 02/03/2021 6:35:12 PM By: Baruch Gouty RN, BSN Entered By: Baruch Gouty on 02/03/2021 08:08:51 -------------------------------------------------------------------------------- Compression Therapy Details Patient Name: Date of Service: Tammy Fitzpatrick, Tammy Fitzpatrick 02/03/2021 8:00 A M Medical Record Number: [id number] Patient Account Number: [id number] Date of Birth/Sex: Treating RN: 05/13/1951 (70 y.o. Elam Dutch Primary Care Karter Hellmer: Marlowe Sax Other Clinician: Referring Necole Minassian: Treating Yvan Dority/Extender: Tyron Russell, Dinah Weeks in Treatment: 5 Compression Therapy Performed for Wound Assessment:  Wound #1 Right,Medial Lower Leg Performed By: Clinician Baruch Gouty, RN Compression Type: Double Layer Post Procedure Diagnosis Same as Pre-procedure Electronic Signature(s) Signed: 02/03/2021 6:35:12 PM By: Baruch Gouty RN, BSN Entered By: Baruch Gouty on 02/03/2021 08:44:01 -------------------------------------------------------------------------------- Encounter Discharge Information Details Patient Name: Date of Service: Tammy Fitzpatrick, Tammy Fitzpatrick 02/03/2021 8:00 A M Medical Record Number: [id number] Patient Account Number: [id number] Date of Birth/Sex: Treating RN: 1950/12/10 (69 y.o. Elam Dutch Primary Care Dekota Kirlin: Marlowe Sax Other Clinician: Referring Melitta Tigue: Treating Bresha Hosack/Extender: Tyron Russell, Dinah Weeks in Treatment: 5 Encounter Discharge Information Items Discharge Condition: Stable Ambulatory Status: Ambulatory Discharge Destination: Home Transportation: Private Auto Accompanied By: self Schedule Follow-up Appointment: Yes Clinical Summary of Care: Patient Declined Electronic Signature(s) Signed: 02/03/2021 6:35:12 PM By: Baruch Gouty RN, BSN Entered By: Baruch Gouty on 02/03/2021 08:54:04 -------------------------------------------------------------------------------- Lower Extremity Assessment Details Patient Name: Date of Service: Tammy Fitzpatrick, Tammy Fitzpatrick 02/03/2021 8:00 A M Medical Record Number: [id number] Patient Account Number: [id number] Date of Birth/Sex: Treating RN: 02-23-1951 (70 y.o. Elam Dutch Primary Care Adil Tugwell: Marlowe Sax Other Clinician: Referring Chad Tiznado: Treating Ezzie Senat/Extender: Worthy Keeler Ngetich, Dinah Weeks in Treatment: 5 Edema Assessment Assessed: [Left: No] [Right: No] Edema: [Left: Ye] [Right: s] Calf Left: Right: Point of Measurement: 29 cm From Medial Instep 47 cm Ankle Left: Right: Point of Measurement: 9 cm From Medial Instep 30.5 cm Vascular  Assessment Pulses: Dorsalis Pedis Palpable: [Right:Yes] Electronic Signature(s) Signed: 02/03/2021 6:35:12 PM By: Baruch Gouty RN, BSN Entered By: Baruch Gouty on 02/03/2021 08:14:58 -------------------------------------------------------------------------------- Multi-Disciplinary Care Plan Details Patient Name: Date of Service: Tammy Fitzpatrick, Tammy Fitzpatrick 02/03/2021 8:00 A M Medical Record Number: [id number] Patient Account Number: [id number] Date of Birth/Sex: Treating RN: [birth year] (70 y.o. Elam Dutch Primary Care Colby Catanese: Marlowe Sax Other Clinician: Referring Anwar Sakata: Treating Justina Bertini/Extender: Tyron Russell, Dinah Weeks in Treatment: 5 Active Inactive Wound/Skin Impairment Nursing Diagnoses: Impaired tissue integrity Goals: Patient/caregiver will verbalize understanding of skin care regimen Date Initiated: 12/28/2020 Target Resolution  Date: 02/19/2021 Goal Status: Active Ulcer/skin breakdown will have a volume reduction of 30% by week 4 Date Initiated: 12/28/2020 Date Inactivated: 01/27/2021 Target Resolution Date: 01/25/2021 Goal Status: Met Interventions: Assess patient/caregiver ability to obtain necessary supplies Assess patient/caregiver ability to perform ulcer/skin care regimen upon admission and as needed Assess ulceration(s) every visit Provide education on ulcer and skin care Treatment Activities: Topical wound management initiated : 12/28/2020 Notes: Electronic Signature(s) Signed: 02/03/2021 6:35:12 PM By: Baruch Gouty RN, BSN Entered By: Baruch Gouty on 02/03/2021 08:23:09 -------------------------------------------------------------------------------- Pain Assessment Details Patient Name: Date of Service: Tammy Fitzpatrick, Tammy Fitzpatrick 02/03/2021 8:00 A M Medical Record Number: [id number] Patient Account Number: [id number] Date of Birth/Sex: Treating RN: 09-Aug-1950 (70 y.o. Elam Dutch Primary Care Yader Criger: Marlowe Sax Other  Clinician: Referring Anjelina Dung: Treating Katheryn Culliton/Extender: Tyron Russell, Dinah Weeks in Treatment: 5 Active Problems Location of Pain Severity and Description of Pain Patient Has Paino No Site Locations Rate the pain. Rate the pain. Current Pain Level: 0 Pain Management and Medication Current Pain Management: Electronic Signature(s) Signed: 02/03/2021 6:35:12 PM By: Baruch Gouty RN, BSN Entered By: Baruch Gouty on 02/03/2021 08:09:37 -------------------------------------------------------------------------------- Patient/Caregiver Education Details Patient Name: Date of Service: Tammy Fitzpatrick 9/14/2022andnbsp8:00 A M Medical Record Number: [id number] Patient Account Number: [id number] Date of Birth/Gender: Treating RN: 08/18/1950 (70 y.o. Elam Dutch Primary Care Physician: Marlowe Sax Other Clinician: Referring Physician: Treating Physician/Extender: Merlene Laughter in Treatment: 5 Education Assessment Education Provided To: Patient Education Topics Provided Venous: Methods: Explain/Verbal Responses: Reinforcements needed, State content correctly Wound/Skin Impairment: Methods: Explain/Verbal Responses: Reinforcements needed, State content correctly Electronic Signature(s) Signed: 02/03/2021 6:35:12 PM By: Baruch Gouty RN, BSN Entered By: Baruch Gouty on 02/03/2021 09:98:33 -------------------------------------------------------------------------------- Wound Assessment Details Patient Name: Date of Service: Tammy Fitzpatrick, Tammy Fitzpatrick 02/03/2021 8:00 A M Medical Record Number: [id number] Patient Account Number: [id number] Date of Birth/Sex: Treating RN: 22-Aug-1950 (70 y.o. Elam Dutch Primary Care Thurmond Hildebran: Marlowe Sax Other Clinician: Referring Derril Franek: Treating Shalen Petrak/Extender: Worthy Keeler Ngetich, Dinah Weeks in Treatment: 5 Wound Status Wound Number: 1 Primary Etiology: Lymphedema Wound  Location: Right, Medial Lower Leg Wound Status: Open Wounding Event: Trauma Comorbid History: Hypertension, Osteoarthritis Date Acquired: 09/21/2020 Weeks Of Treatment: 5 Clustered Wound: No Photos Wound Measurements Length: (cm) 0.4 Width: (cm) 0.7 Depth: (cm) 0.5 Area: (cm) 0.22 Volume: (cm) 0.11 % Reduction in Area: 97.5% % Reduction in Volume: 95.9% Epithelialization: Small (1-33%) Tunneling: Yes Position (o'clock): 12 Maximum Distance: (cm) 1.1 Undermining: No Wound Description Classification: Full Thickness Without Exposed Support Structures Wound Margin: Distinct, outline attached Exudate Amount: Small Exudate Type: Serosanguineous Exudate Color: red, brown Foul Odor After Cleansing: No Slough/Fibrino No Wound Bed Granulation Amount: Large (67-100%) Exposed Structure Granulation Quality: Red Fascia Exposed: No Necrotic Amount: None Present (0%) Fat Layer (Subcutaneous Tissue) Exposed: Yes Tendon Exposed: No Muscle Exposed: No Joint Exposed: No Bone Exposed: No Treatment Notes Wound #1 (Lower Leg) Wound Laterality: Right, Medial Cleanser Soap and Water Discharge Instruction: May shower and wash wound with dial antibacterial soap and water prior to dressing change. Wound Cleanser Discharge Instruction: Cleanse the wound with wound cleanser prior to applying a clean dressing using gauze sponges, not tissue or cotton balls. Peri-Wound Care Sween Lotion (Moisturizing lotion) Discharge Instruction: Apply moisturizing lotion to leg Topical Primary Dressing Hydrofera Blue Ready Foam, 2.5 x2.5 in Discharge Instruction: Cut slightly smaller than tunnel Secondary Dressing Woven Gauze Sponge, Non-Sterile 4x4 in Discharge Instruction: Apply over primary dressing as directed. Secured With Compression  Wrap CoFlex TLC XL 2-layer Compression System 4x7 (in/yd) Discharge Instruction: Apply CoFlex 2-layer compression as directed. (alt for 4 layer) Compression  Stockings Add-Ons Electronic Signature(s) Signed: 02/03/2021 6:35:12 PM By: Baruch Gouty RN, BSN Entered By: Baruch Gouty on 02/03/2021 08:21:02 -------------------------------------------------------------------------------- Vitals Details Patient Name: Date of Service: Tammy Fitzpatrick, Tammy Fitzpatrick 02/03/2021 8:00 A M Medical Record Number: [id number] Patient Account Number: [id number] Date of Birth/Sex: Treating RN: 1951/01/02 (70 y.o. Elam Dutch Primary Care Lylliana Kitamura: Marlowe Sax Other Clinician: Referring Yennifer Segovia: Treating Daison Braxton/Extender: Tyron Russell, Dinah Weeks in Treatment: 5 Vital Signs Time Taken: 08:06 Temperature (F): 98.3 Height (in): 69 Pulse (bpm): 134 Source: Stated Respiratory Rate (breaths/min): 20 Weight (lbs): 250 Blood Pressure (mmHg): 153/88 Source: Stated Reference Range: 80 - 120 mg / dl Body Mass Index (BMI): 36.9 Electronic Signature(s) Signed: 02/03/2021 6:35:12 PM By: Baruch Gouty RN, BSN Entered By: Baruch Gouty on 02/03/2021 00:93:81

## 2021-02-10 ENCOUNTER — Other Ambulatory Visit: Payer: Self-pay

## 2021-02-10 ENCOUNTER — Encounter (HOSPITAL_BASED_OUTPATIENT_CLINIC_OR_DEPARTMENT_OTHER): Payer: Medicare Other | Admitting: Physician Assistant

## 2021-02-10 DIAGNOSIS — L97812 Non-pressure chronic ulcer of other part of right lower leg with fat layer exposed: Secondary | ICD-10-CM | POA: Diagnosis not present

## 2021-02-10 NOTE — Progress Notes (Signed)
Tammy Fitzpatrick, Tammy Fitzpatrick ([id number]) Visit Report for 02/10/2021 Arrival Information Details Patient Name: Date of Service: Tammy Fitzpatrick, Tammy Fitzpatrick 02/10/2021 8:00 A M Medical Record Number: [id number] Patient Account Number: [id number] Date of Birth/Sex: Treating RN: [birth year] (70 y.o. Helene Shoe, Meta.Reding Primary Care Morgon Pamer: Marlowe Sax Other Clinician: Referring Brix Brearley: Treating Adi Seales/Extender: Tyron Russell, Dinah Weeks in Treatment: 6 Visit Information History Since Last Visit Added or deleted any medications: No Patient Arrived: Ambulatory Any new allergies or adverse reactions: No Arrival Time: 08:08 Had a fall or experienced change in No Accompanied By: self activities of daily living that may affect Transfer Assistance: None risk of falls: Patient Identification Verified: Yes Signs or symptoms of abuse/neglect since last visito No Secondary Verification Process Completed: Yes Hospitalized since last visit: No Patient Requires Transmission-Based Precautions: No Implantable device outside of the clinic excluding No Patient Has Alerts: No cellular tissue based products placed in the center since last visit: Has Dressing in Place as Prescribed: Yes Has Compression in Place as Prescribed: Yes Pain Present Now: No Electronic Signature(s) Signed: 02/10/2021 5:58:14 PM By: Deon Pilling RN, BSN Entered By: Deon Pilling on 02/10/2021 08:11:56 -------------------------------------------------------------------------------- Compression Therapy Details Patient Name: Date of Service: Tammy Fitzpatrick, Tammy Fitzpatrick 02/10/2021 8:00 [place] Record Number: [id number] Patient Account Number: [id number] Date of Birth/Sex: Treating RN: 17-Jul-1950 (70 y.o. Elam Dutch Primary Care Lyliana Dicenso: Marlowe Sax Other Clinician: Referring Jaesean Litzau: Treating Bentlie Withem/Extender: Tyron Russell, Dinah Weeks in Treatment: 6 Compression Therapy Performed for Wound Assessment: Wound #1  Right,Medial Lower Leg Performed By: Clinician Deon Pilling, RN Compression Type: Double Layer Post Procedure Diagnosis Same as Pre-procedure Electronic Signature(s) Signed: 02/10/2021 5:43:52 PM By: Baruch Gouty RN, BSN Entered By: Baruch Gouty on 02/10/2021 09:08:14 -------------------------------------------------------------------------------- Encounter Discharge Information Details Patient Name: Date of Service: Tammy Fitzpatrick, Tammy Fitzpatrick 02/10/2021 8:00 [place] Record Number: [id number] Patient Account Number: [id number] Date of Birth/Sex: Treating RN: 10-Jun-1950 (70 y.o. Elam Dutch Primary Care Bacilio Abascal: Marlowe Sax Other Clinician: Referring Quashaun Lazalde: Treating Elvena Oyer/Extender: Tyron Russell, Dinah Weeks in Treatment: 6 Encounter Discharge Information Items Discharge Condition: Stable Ambulatory Status: Ambulatory Discharge Destination: Home Transportation: Private Auto Accompanied By: self Schedule Follow-up Appointment: Yes Clinical Summary of Care: Patient Declined Electronic Signature(s) Signed: 02/10/2021 5:43:52 PM By: Baruch Gouty RN, BSN Entered By: Baruch Gouty on 02/10/2021 15:19:52 -------------------------------------------------------------------------------- Lower Extremity Assessment Details Patient Name: Date of Service: Tammy Fitzpatrick, Tammy Fitzpatrick 02/10/2021 8:00 [place] Record Number: [id number] Patient Account Number: [id number] Date of Birth/Sex: Treating RN: 08/22/1950 (70 y.o. Debby Bud Primary Care Auset Fritzler: Marlowe Sax Other Clinician: Referring Letzy Gullickson: Treating Sudeep Scheibel/Extender: Worthy Keeler Ngetich, Dinah Weeks in Treatment: 6 Edema Assessment Assessed: [Left: No] [Right: Yes] Edema: [Left: Ye] [Right: s] Calf Left: Right: Point of Measurement: 29 cm From Medial Instep 46 cm Ankle Left: Right: Point of Measurement: 9 cm From Medial Instep 31 cm Vascular Assessment Pulses: Dorsalis  Pedis Palpable: [Right:Yes] Electronic Signature(s) Signed: 02/10/2021 5:58:14 PM By: Deon Pilling RN, BSN Entered By: Deon Pilling on 02/10/2021 08:12:34 -------------------------------------------------------------------------------- Multi-Disciplinary Care Plan Details Patient Name: Date of Service: Tammy Fitzpatrick, Tammy Fitzpatrick 02/10/2021 8:00 A M Medical Record Number: [id number] Patient Account Number: [id number] Date of Birth/Sex: Treating RN: 1950-07-07 (70 y.o. Elam Dutch Primary Care Norma Montemurro: Marlowe Sax Other Clinician: Referring Cruise Baumgardner: Treating Chancy Smigiel/Extender: Tyron Russell, Dinah Weeks in Treatment: 6 Active Inactive Wound/Skin Impairment Nursing Diagnoses: Impaired tissue integrity Goals: Patient/caregiver will verbalize understanding of skin care regimen Date Initiated: 12/28/2020 Target Resolution  Date: 02/19/2021 Goal Status: Active Ulcer/skin breakdown will have a volume reduction of 30% by week 4 Date Initiated: 12/28/2020 Date Inactivated: 01/27/2021 Target Resolution Date: 01/25/2021 Goal Status: Met Interventions: Assess patient/caregiver ability to obtain necessary supplies Assess patient/caregiver ability to perform ulcer/skin care regimen upon admission and as needed Assess ulceration(s) every visit Provide education on ulcer and skin care Treatment Activities: Topical wound management initiated : 12/28/2020 Notes: Electronic Signature(s) Signed: 02/10/2021 5:43:52 PM By: Baruch Gouty RN, BSN Entered By: Baruch Gouty on 02/10/2021 09:06:54 -------------------------------------------------------------------------------- Pain Assessment Details Patient Name: Date of Service: Tammy Fitzpatrick, Tammy Fitzpatrick 02/10/2021 8:00 A M Medical Record Number: [id number] Patient Account Number: [id number] Date of Birth/Sex: Treating RN: 1951-02-23 (70 y.o. Debby Bud Primary Care Quinnie Barcelo: Marlowe Sax Other Clinician: Referring Dayle Sherpa: Treating  Tristan Proto/Extender: Worthy Keeler Ngetich, Dinah Weeks in Treatment: 6 Active Problems Location of Pain Severity and Description of Pain Patient Has Paino No Site Locations Rate the pain. Rate the pain. Current Pain Level: 0 Pain Management and Medication Current Pain Management: Medication: No Cold Application: No Rest: No Massage: No Activity: No T.E.N.S.: No Heat Application: No Leg drop or elevation: No Is the Current Pain Management Adequate: Adequate How does your wound impact your activities of daily livingo Sleep: No Bathing: No Appetite: No Relationship With Others: No Bladder Continence: No Emotions: No Bowel Continence: No Work: No Toileting: No Drive: No Dressing: No Hobbies: No Engineer, maintenance) Signed: 02/10/2021 5:58:14 PM By: Deon Pilling RN, BSN Entered By: Deon Pilling on 02/10/2021 08:12:25 -------------------------------------------------------------------------------- Patient/Caregiver Education Details Patient Name: Date of Service: Tammy Fitzpatrick, Tammy Fitzpatrick 9/21/2022andnbsp8:00 A M Medical Record Number: [id number] Patient Account Number: [id number] Date of Birth/Gender: Treating RN: 07/05/1950 (70 y.o. Elam Dutch Primary Care Physician: Marlowe Sax Other Clinician: Referring Physician: Treating Physician/Extender: Merlene Laughter in Treatment: 6 Education Assessment Education Provided To: Patient Education Topics Provided Venous: Methods: Explain/Verbal Responses: Reinforcements needed, State content correctly Wound/Skin Impairment: Methods: Explain/Verbal Responses: Reinforcements needed, State content correctly Electronic Signature(s) Signed: 02/10/2021 5:43:52 PM By: Baruch Gouty RN, BSN Signed: 02/10/2021 5:43:52 PM By: Baruch Gouty RN, BSN Entered By: Baruch Gouty on 02/10/2021 09:07:14 -------------------------------------------------------------------------------- Wound Assessment  Details Patient Name: Date of Service: Tammy Fitzpatrick, Tammy Fitzpatrick 02/10/2021 8:00 A M Medical Record Number: [id number] Patient Account Number: [id number] Date of Birth/Sex: Treating RN: Oct 05, 1950 (70 y.o. Helene Shoe, Meta.Reding Primary Care Cabela Pacifico: Marlowe Sax Other Clinician: Referring Morgon Pamer: Treating Brennin Durfee/Extender: Worthy Keeler Ngetich, Dinah Weeks in Treatment: 6 Wound Status Wound Number: 1 Primary Etiology: Lymphedema Wound Location: Right, Medial Lower Leg Wound Status: Open Wounding Event: Trauma Comorbid History: Hypertension, Osteoarthritis Date Acquired: 09/21/2020 Weeks Of Treatment: 6 Clustered Wound: No Photos Wound Measurements Length: (cm) 0.3 Width: (cm) 0.5 Depth: (cm) 0.8 Area: (cm) 0.118 Volume: (cm) 0.094 % Reduction in Area: 98.7% % Reduction in Volume: 96.5% Epithelialization: Small (1-33%) Tunneling: Yes Position (o'clock): 12 Maximum Distance: (cm) 1.2 Undermining: No Wound Description Classification: Full Thickness Without Exposed Support Structures Wound Margin: Distinct, outline attached Exudate Amount: Small Exudate Type: Serosanguineous Exudate Color: red, brown Foul Odor After Cleansing: No Slough/Fibrino No Wound Bed Granulation Amount: Large (67-100%) Exposed Structure Granulation Quality: Red Fascia Exposed: No Necrotic Amount: None Present (0%) Fat Layer (Subcutaneous Tissue) Exposed: Yes Tendon Exposed: No Muscle Exposed: No Joint Exposed: No Bone Exposed: No Treatment Notes Wound #1 (Lower Leg) Wound Laterality: Right, Medial Cleanser Soap and Water Discharge Instruction: May shower and wash wound with dial antibacterial soap and water prior to  dressing change. Wound Cleanser Discharge Instruction: Cleanse the wound with wound cleanser prior to applying a clean dressing using gauze sponges, not tissue or cotton balls. Peri-Wound Care Sween Lotion (Moisturizing lotion) Discharge Instruction: Apply moisturizing lotion  to leg Topical Primary Dressing Promogran Prisma Matrix, 4.34 (sq in) (silver collagen) Discharge Instruction: Moisten collagen with saline or hydrogel Secondary Dressing Woven Gauze Sponge, Non-Sterile 4x4 in Discharge Instruction: Apply over primary dressing as directed. Woven Gauze Sponges 2x2 in Discharge Instruction: pack lightly behind collagen to hold it in base of wound Secured With Compression Wrap CoFlex TLC XL 2-layer Compression System 4x7 (in/yd) Discharge Instruction: Apply CoFlex 2-layer compression as directed. (alt for 4 layer) Compression Stockings Add-Ons Electronic Signature(s) Signed: 02/10/2021 10:42:28 AM By: Sandre Kitty Signed: 02/10/2021 5:58:14 PM By: Deon Pilling RN, BSN Entered By: Sandre Kitty on 02/10/2021 08:16:18 -------------------------------------------------------------------------------- Vitals Details Patient Name: Date of Service: Tammy Fitzpatrick, Tammy Fitzpatrick 02/10/2021 8:00 A M Medical Record Number: [id number] Patient Account Number: [id number] Date of Birth/Sex: Treating RN: Oct 02, 1950 (70 y.o. Debby Bud Primary Care Gao Mitnick: Marlowe Sax Other Clinician: Referring Juanmanuel Marohl: Treating Clemmie Buelna/Extender: Worthy Keeler Ngetich, Dinah Weeks in Treatment: 6 Vital Signs Time Taken: 08:08 Temperature (F): 98.9 Height (in): 69 Pulse (bpm): 121 Weight (lbs): 250 Respiratory Rate (breaths/min): 20 Body Mass Index (BMI): 36.9 Blood Pressure (mmHg): 143/96 Reference Range: 80 - 120 mg / dl Electronic Signature(s) Signed: 02/10/2021 5:58:14 PM By: Deon Pilling RN, BSN Entered By: Deon Pilling on 02/10/2021 08:12:14

## 2021-02-11 NOTE — Progress Notes (Signed)
Tammy Fitzpatrick, Tammy Fitzpatrick ([id number]) Visit Report for 02/10/2021 Chief Complaint Document Details Patient Name: Date of Service: Tammy Fitzpatrick, Tammy Fitzpatrick 02/10/2021 8:00 A M Medical Record Number: [id number] Patient Account Number: [id number] Date of Birth/Sex: Treating RN: 03-30-1951 (70 y.o. [name] Primary Care Provider: [name] Other Clinician: Referring Provider: Treating Provider/Extender: Erie Noe, Dinah Weeks in Treatment: 6 Information Obtained from: Patient Chief Complaint Right leg ulcer following dog scratch June 2022 Electronic Signature(s) Signed: 02/10/2021 9:11:58 AM By: Lenda Kelp PA-C Entered By: Lenda Kelp on 02/10/2021 09:11:58 -------------------------------------------------------------------------------- HPI Details Patient Name: Date of Service: Tammy Fitzpatrick, Tammy Fitzpatrick 02/10/2021 8:00 A M Medical Record Number: [id number] Patient Account Number: [id number] Date of Birth/Sex: Treating RN: 1951/02/21 (70 y.o. [name] Primary Care Provider: [name] Other Clinician: Referring Provider: Treating Provider/Extender: Erie Noe, Dinah Weeks in Treatment: 6 History of Present Illness HPI Description: 12/28/2020 upon evaluation today patient presents for initial evaluation here in our clinic concerning a wound that she has over the right medial lower leg. This is subsequently a result of initially a trauma from her dog where he jumped up on her and caused a laceration. With that being said unfortunately the patient has been having issues with this since that time trying to get this healed. Specifically the initial injury occurred to November 16, 2020. The sutures did seem to be his around 12/07/2020 she was placed on doxycycline. Following that time she has been using Xeroform and a border gauze dressing currently which again is keeping it covered but that is about it. With regard to medical history the patient does have what  appears to be an obvious history of chronic venous insufficiency and lymphedema. She also has high blood pressure noted today.. 01/06/2021 upon evaluation today patient appears to be doing well with regard to her wound. This actually appears to be significantly improved as compared to last week. She has been tolerating the dressing changes. In fact there really does not appear to be anything Require sharp debridement today as she is truly doing so well. She does not necessarily like the compression wrap which to be honest I completely understand but nonetheless I do believe its been beneficial as well as far as getting this wound to heal appropriately. 01/13/2021 upon evaluation today patient appears to be doing well with regard to her leg ulcer. She has been tolerating the dressing changes without complication. Hydrofera Blue was done excellent job up to this point. With that being said I do not believe that we are seeing any signs of worsening and in fact everything appears to be significantly improved compared to previous. 01/20/2021 upon evaluation today patient appears to be doing much better in regard to her wound. This is actually showing signs of significant improvement which is also news. I do not see any evidence of active infection which is also great and in general I think that we are headed in the appropriate direction. 01/27/2021 upon evaluation today patient appears to be doing well with regard to her wound. She has been tolerating the dressing changes without complication. Fortunately there does not appear to be any signs of active infection which is great news and overall I am extremely pleased with where we stand today. No fevers, chills, nausea, vomiting, or diarrhea. 02/03/2021 upon evaluation today patient appears to be doing well with regard to her wound. She has been tolerating the dressing changes without complication. Fortunately there does not appear to be any evidence of  active  infection which is great news and overall I am extremely pleased with where we stand today. No fevers, chills, nausea, vomiting, or diarrhea. 02/10/2021 upon evaluation today patient's wound is actually showing signs of good improvement. Everything is a healed around the edge and mainly what were dealing with currently is simply the main issue with the primary/deeper region where she did have again in the beginning a much deeper hole. We been using the Hydrofera Blue tucked into this area but I think its not really allowing it to healing as effectively as I would like to see. I think we may want to change to something a little different today. Electronic Signature(s) Signed: 02/10/2021 1:12:43 PM By: Lenda Kelp PA-C Entered By: Lenda Kelp on 02/10/2021 13:12:43 -------------------------------------------------------------------------------- Physical Exam Details Patient Name: Date of Service: Tammy Fitzpatrick, Tammy Fitzpatrick 02/10/2021 8:00 A M Medical Record Number: [id number] Patient Account Number: [id number] Date of Birth/Sex: Treating RN: June 07, 1950 (70 y.o. [name] Primary Care Provider: [name] Other Clinician: Referring Provider: Treating Provider/Extender: Lenda Kelp Ngetich, Dinah Weeks in Treatment: 6 Constitutional Well-nourished and well-hydrated in no acute distress. Respiratory normal breathing without difficulty. Psychiatric this patient is able to make decisions and demonstrates good insight into disease process. Alert and Oriented x 3. pleasant and cooperative. Notes Patient's wound bed actually showed signs of excellent epithelization and granulation and very pleased in that regard I do not see any signs of infection currently which is great news. Overall I think that we are headed in the right direction though I think collagen may help to help fill in this tunnel a little bit better which would be ideal. Electronic Signature(s) Signed: 02/10/2021  1:13:01 PM By: Lenda Kelp PA-C Entered By: Lenda Kelp on 02/10/2021 13:13:01 -------------------------------------------------------------------------------- Physician Orders Details Patient Name: Date of Service: Tammy Fitzpatrick, Tammy Fitzpatrick 02/10/2021 8:00 A M Medical Record Number: [id number] Patient Account Number: [id number] Date of Birth/Sex: Treating RN: [birth year] (70 y.o. [name] Primary Care Provider: [name] Other Clinician: Referring Provider: Treating Provider/Extender: Erie Noe, Dinah Weeks in Treatment: 6 Verbal / Phone Orders: No Diagnosis Coding ICD-10 Coding Code Description I87.2 Venous insufficiency (chronic) (peripheral) I89.0 Lymphedema, not elsewhere classified L97.812 Non-pressure chronic ulcer of other part of right lower leg with fat layer exposed I10 Essential (primary) hypertension Follow-up Appointments ppointment in 1 week. - with Leonard Schwartz Return A Bathing/ Shower/ Hygiene May shower with protection but do not get wound dressing(s) wet. Edema Control - Lymphedema / SCD / Other Elevate legs to the level of the heart or above for 30 minutes daily and/or when sitting, a frequency of: Avoid standing for long periods of time. Exercise regularly Additional Orders / Instructions Follow Nutritious Diet Wound Treatment Wound #1 - Lower Leg Wound Laterality: Right, Medial Cleanser: Soap and Water 1 x Per Week/15 Days Discharge Instructions: May shower and wash wound with dial antibacterial soap and water prior to dressing change. Cleanser: Wound Cleanser 1 x Per Week/15 Days Discharge Instructions: Cleanse the wound with wound cleanser prior to applying a clean dressing using gauze sponges, not tissue or cotton balls. Peri-Wound Care: Sween Lotion (Moisturizing lotion) 1 x Per Week/15 Days Discharge Instructions: Apply moisturizing lotion to leg Prim Dressing: Promogran Prisma Matrix, 4.34 (sq in) (silver collagen) 1 x Per Week/15  Days ary Discharge Instructions: Moisten collagen with saline or hydrogel Secondary Dressing: Woven Gauze Sponge, Non-Sterile 4x4 in 1 x Per Week/15 Days Discharge Instructions: Apply over primary dressing as  directed. Secondary Dressing: Woven Gauze Sponges 2x2 in 1 x Per Week/15 Days Discharge Instructions: pack lightly behind collagen to hold it in base of wound Compression Wrap: CoFlex TLC XL 2-layer Compression System 4x7 (in/yd) 1 x Per Week/15 Days Discharge Instructions: Apply CoFlex 2-layer compression as directed. (alt for 4 layer) Electronic Signature(s) Signed: 02/10/2021 5:43:52 PM By: Zenaida Deed RN, BSN Signed: 02/11/2021 2:21:00 PM By: Lenda Kelp PA-C Entered By: Zenaida Deed on 02/10/2021 09:16:58 -------------------------------------------------------------------------------- Problem List Details Patient Name: Date of Service: Tammy Fitzpatrick, Tammy Fitzpatrick 02/10/2021 8:00 A M Medical Record Number: [id number] Patient Account Number: [id number] Date of Birth/Sex: Treating RN: 1951/03/26 (70 y.o. [name] Primary Care Provider: [name] Other Clinician: Referring Provider: Treating Provider/Extender: Erie Noe, Dinah Weeks in Treatment: 6 Active Problems ICD-10 Encounter Code Description Active Date MDM Diagnosis I87.2 Venous insufficiency (chronic) (peripheral) 12/28/2020 No Yes I89.0 Lymphedema, not elsewhere classified 12/28/2020 No Yes L97.812 Non-pressure chronic ulcer of other part of right lower leg with fat layer 12/28/2020 No Yes exposed I10 Essential (primary) hypertension 12/28/2020 No Yes Inactive Problems Resolved Problems Electronic Signature(s) Signed: 02/10/2021 9:11:45 AM By: Lenda Kelp PA-C Entered By: Lenda Kelp on 02/10/2021 09:11:45 -------------------------------------------------------------------------------- Progress Note Details Patient Name: Date of Service: Tammy Fitzpatrick, Tammy Fitzpatrick 02/10/2021 8:00 A  M Medical Record Number: [id number] Patient Account Number: [id number] Date of Birth/Sex: Treating RN: 1950/11/14 (70 y.o. [name] Primary Care Provider: [name] Other Clinician: Referring Provider: Treating Provider/Extender: Erie Noe, Dinah Weeks in Treatment: 6 Subjective Chief Complaint Information obtained from Patient Right leg ulcer following dog scratch June 2022 History of Present Illness (HPI) 12/28/2020 upon evaluation today patient presents for initial evaluation here in our clinic concerning a wound that she has over the right medial lower leg. This is subsequently a result of initially a trauma from her dog where he jumped up on her and caused a laceration. With that being said unfortunately the patient has been having issues with this since that time trying to get this healed. Specifically the initial injury occurred to November 16, 2020. The sutures did seem to be his around 12/07/2020 she was placed on doxycycline. Following that time she has been using Xeroform and a border gauze dressing currently which again is keeping it covered but that is about it. With regard to medical history the patient does have what appears to be an obvious history of chronic venous insufficiency and lymphedema. She also has high blood pressure noted today.. 01/06/2021 upon evaluation today patient appears to be doing well with regard to her wound. This actually appears to be significantly improved as compared to last week. She has been tolerating the dressing changes. In fact there really does not appear to be anything Require sharp debridement today as she is truly doing so well. She does not necessarily like the compression wrap which to be honest I completely understand but nonetheless I do believe its been beneficial as well as far as getting this wound to heal appropriately. 01/13/2021 upon evaluation today patient appears to be doing well with regard to her leg ulcer.  She has been tolerating the dressing changes without complication. Hydrofera Blue was done excellent job up to this point. With that being said I do not believe that we are seeing any signs of worsening and in fact everything appears to be significantly improved compared to previous. 01/20/2021 upon evaluation today patient appears to be doing much better in regard to her wound.  This is actually showing signs of significant improvement which is also news. I do not see any evidence of active infection which is also great and in general I think that we are headed in the appropriate direction. 01/27/2021 upon evaluation today patient appears to be doing well with regard to her wound. She has been tolerating the dressing changes without complication. Fortunately there does not appear to be any signs of active infection which is great news and overall I am extremely pleased with where we stand today. No fevers, chills, nausea, vomiting, or diarrhea. 02/03/2021 upon evaluation today patient appears to be doing well with regard to her wound. She has been tolerating the dressing changes without complication. Fortunately there does not appear to be any evidence of active infection which is great news and overall I am extremely pleased with where we stand today. No fevers, chills, nausea, vomiting, or diarrhea. 02/10/2021 upon evaluation today patient's wound is actually showing signs of good improvement. Everything is a healed around the edge and mainly what were dealing with currently is simply the main issue with the primary/deeper region where she did have again in the beginning a much deeper hole. We been using the Hydrofera Blue tucked into this area but I think its not really allowing it to healing as effectively as I would like to see. I think we may want to change to something a little different today. Objective Constitutional Well-nourished and well-hydrated in no acute distress. Vitals Time Taken: 8:08  AM, Height: 69 in, Weight: 250 lbs, BMI: 36.9, Temperature: 98.9 F, Pulse: 121 bpm, Respiratory Rate: 20 breaths/min, Blood Pressure: 143/96 mmHg. Respiratory normal breathing without difficulty. Psychiatric this patient is able to make decisions and demonstrates good insight into disease process. Alert and Oriented x 3. pleasant and cooperative. General Notes: Patient's wound bed actually showed signs of excellent epithelization and granulation and very pleased in that regard I do not see any signs of infection currently which is great news. Overall I think that we are headed in the right direction though I think collagen may help to help fill in this tunnel a little bit better which would be ideal. Integumentary (Hair, Skin) Wound #1 status is Open. Original cause of wound was Trauma. The date acquired was: 09/21/2020. The wound has been in treatment 6 weeks. The wound is located on the Right,Medial Lower Leg. The wound measures 0.3cm length x 0.5cm width x 0.8cm depth; 0.118cm^2 area and 0.094cm^3 volume. There is Fat Layer (Subcutaneous Tissue) exposed. There is no undermining noted, however, there is tunneling at 12:00 with a maximum distance of 1.2cm. There is a small amount of serosanguineous drainage noted. The wound margin is distinct with the outline attached to the wound base. There is large (67-100%) red granulation within the wound bed. There is no necrotic tissue within the wound bed. Assessment Active Problems ICD-10 Venous insufficiency (chronic) (peripheral) Lymphedema, not elsewhere classified Non-pressure chronic ulcer of other part of right lower leg with fat layer exposed Essential (primary) hypertension Procedures Wound #1 Pre-procedure diagnosis of Wound #1 is a Lymphedema located on the Right,Medial Lower Leg . There was a Double Layer Compression Therapy Procedure by Shawn Stall, RN. Post procedure Diagnosis Wound #1: Same as Pre-Procedure Plan Follow-up  Appointments: Return Appointment in 1 week. - with Luana Shu Shower/ Hygiene: May shower with protection but do not get wound dressing(s) wet. Edema Control - Lymphedema / SCD / Other: Elevate legs to the level of the heart or above for  30 minutes daily and/or when sitting, a frequency of: Avoid standing for long periods of time. Exercise regularly Additional Orders / Instructions: Follow Nutritious Diet WOUND #1: - Lower Leg Wound Laterality: Right, Medial Cleanser: Soap and Water 1 x Per Week/15 Days Discharge Instructions: May shower and wash wound with dial antibacterial soap and water prior to dressing change. Cleanser: Wound Cleanser 1 x Per Week/15 Days Discharge Instructions: Cleanse the wound with wound cleanser prior to applying a clean dressing using gauze sponges, not tissue or cotton balls. Peri-Wound Care: Sween Lotion (Moisturizing lotion) 1 x Per Week/15 Days Discharge Instructions: Apply moisturizing lotion to leg Prim Dressing: Promogran Prisma Matrix, 4.34 (sq in) (silver collagen) 1 x Per Week/15 Days ary Discharge Instructions: Moisten collagen with saline or hydrogel Secondary Dressing: Woven Gauze Sponge, Non-Sterile 4x4 in 1 x Per Week/15 Days Discharge Instructions: Apply over primary dressing as directed. Secondary Dressing: Woven Gauze Sponges 2x2 in 1 x Per Week/15 Days Discharge Instructions: pack lightly behind collagen to hold it in base of wound Com pression Wrap: CoFlex TLC XL 2-layer Compression System 4x7 (in/yd) 1 x Per Week/15 Days Discharge Instructions: Apply CoFlex 2-layer compression as directed. (alt for 4 layer) 1. Would recommend currently that we going continue with wound care measures as before the patient is in agreement with plan this includes the use of the compression wrapping which I think is awesome for her. 2. We will use silver collagen followed by just a little bit of gauze to hold this in place packed and behind in order to  hopefully help with more epithelial growth in this tunnel. 3. I am also can recommend the patient continue to elevate her legs much as possible to help with edema control. We will see patient back for reevaluation in 1 week here in the clinic. If anything worsens or changes patient will contact our office for additional recommendations. Electronic Signature(s) Signed: 02/10/2021 1:13:28 PM By: Lenda Kelp PA-C Entered By: Lenda Kelp on 02/10/2021 13:13:28 -------------------------------------------------------------------------------- SuperBill Details Patient Name: Date of Service: Tammy Fitzpatrick, Tammy Fitzpatrick 02/10/2021 Medical Record Number: [id number] Patient Account Number: [id number] Date of Birth/Sex: Treating RN: [birth year] (71 y.o. [name] Primary Care Provider: [name] Other Clinician: Referring Provider: Treating Provider/Extender: Erie Noe, Dinah Weeks in Treatment: 6 Diagnosis Coding ICD-10 Codes Code Description I87.2 Venous insufficiency (chronic) (peripheral) I89.0 Lymphedema, not elsewhere classified L97.812 Non-pressure chronic ulcer of other part of right lower leg with fat layer exposed I10 Essential (primary) hypertension Facility Procedures CPT4 Code: [id number] Description: (Facility Use Only) [phone] - APPLY MULTLAY COMPRS LWR RT LEG Modifier: Quantity: 1 Physician Procedures : CPT4 Code Description Modifier [id number] - WC PHYS LEVEL 3 - EST PT ICD-10 Diagnosis Description I87.2 Venous insufficiency (chronic) (peripheral) I89.0 Lymphedema, not elsewhere classified L97.812 Non-pressure chronic ulcer of other part of right  lower leg with fat layer exposed I10 Essential (primary) hypertension Quantity: 1 Electronic Signature(s) Signed: 02/10/2021 1:14:09 PM By: Lenda Kelp PA-C Entered By: Lenda Kelp on 02/10/2021 13:14:09

## 2021-02-17 ENCOUNTER — Other Ambulatory Visit: Payer: Self-pay

## 2021-02-17 ENCOUNTER — Encounter (HOSPITAL_BASED_OUTPATIENT_CLINIC_OR_DEPARTMENT_OTHER): Payer: Medicare Other | Admitting: Physician Assistant

## 2021-02-17 DIAGNOSIS — L97812 Non-pressure chronic ulcer of other part of right lower leg with fat layer exposed: Secondary | ICD-10-CM | POA: Diagnosis not present

## 2021-02-17 NOTE — Progress Notes (Addendum)
RONNESHA, MESTER ([id number]) Visit Report for 02/17/2021 Chief Complaint Document Details Patient Name: Date of Service: DELORA, GRAVATT 02/17/2021 9:15 A M Medical Record Number: [id number] Patient Account Number: [id number] Date of Birth/Sex: Treating RN: 01/04/1951 (70 y.o. [name] Primary Care Provider: [name] Other Clinician: Referring Provider: Treating Provider/Extender: Erie Noe, Dinah Weeks in Treatment: 7 Information Obtained from: Patient Chief Complaint Right leg ulcer following dog scratch June 2022 Electronic Signature(s) Signed: 02/17/2021 9:37:34 AM By: Lenda Kelp PA-C Entered By: Lenda Kelp on 02/17/2021 09:37:34 -------------------------------------------------------------------------------- HPI Details Patient Name: Date of Service: FENIX, RUPPE 02/17/2021 9:15 A M Medical Record Number: [id number] Patient Account Number: [id number] Date of Birth/Sex: Treating RN: 15-Jun-1950 (70 y.o. [name] Primary Care Provider: [name] Other Clinician: Referring Provider: Treating Provider/Extender: Erie Noe, Dinah Weeks in Treatment: 7 History of Present Illness HPI Description: 12/28/2020 upon evaluation today patient presents for initial evaluation here in our clinic concerning a wound that she has over the right medial lower leg. This is subsequently a result of initially a trauma from her dog where he jumped up on her and caused a laceration. With that being said unfortunately the patient has been having issues with this since that time trying to get this healed. Specifically the initial injury occurred to November 16, 2020. The sutures did seem to be his around 12/07/2020 she was placed on doxycycline. Following that time she has been using Xeroform and a border gauze dressing currently which again is keeping it covered but that is about it. With regard to medical history the patient does have what  appears to be an obvious history of chronic venous insufficiency and lymphedema. She also has high blood pressure noted today.. 01/06/2021 upon evaluation today patient appears to be doing well with regard to her wound. This actually appears to be significantly improved as compared to last week. She has been tolerating the dressing changes. In fact there really does not appear to be anything Require sharp debridement today as she is truly doing so well. She does not necessarily like the compression wrap which to be honest I completely understand but nonetheless I do believe its been beneficial as well as far as getting this wound to heal appropriately. 01/13/2021 upon evaluation today patient appears to be doing well with regard to her leg ulcer. She has been tolerating the dressing changes without complication. Hydrofera Blue was done excellent job up to this point. With that being said I do not believe that we are seeing any signs of worsening and in fact everything appears to be significantly improved compared to previous. 01/20/2021 upon evaluation today patient appears to be doing much better in regard to her wound. This is actually showing signs of significant improvement which is also news. I do not see any evidence of active infection which is also great and in general I think that we are headed in the appropriate direction. 01/27/2021 upon evaluation today patient appears to be doing well with regard to her wound. She has been tolerating the dressing changes without complication. Fortunately there does not appear to be any signs of active infection which is great news and overall I am extremely pleased with where we stand today. No fevers, chills, nausea, vomiting, or diarrhea. 02/03/2021 upon evaluation today patient appears to be doing well with regard to her wound. She has been tolerating the dressing changes without complication. Fortunately there does not appear to be any evidence of  active  infection which is great news and overall I am extremely pleased with where we stand today. No fevers, chills, nausea, vomiting, or diarrhea. 02/10/2021 upon evaluation today patient's wound is actually showing signs of good improvement. Everything is a healed around the edge and mainly what were dealing with currently is simply the main issue with the primary/deeper region where she did have again in the beginning a much deeper hole. We been using the Hydrofera Blue tucked into this area but I think its not really allowing it to healing as effectively as I would like to see. I think we may want to change to something a little different today. 02/17/2021 upon evaluation today patient appears to be doing well with regard to her wound. She has been tolerating the dressing changes without complication. Good news is there does not appear to be any signs of infection currently the bad news is this is still taking much longer than we were hoping to get this small tunnel to fill-in. Electronic Signature(s) Signed: 02/19/2021 4:02:45 PM By: Lenda Kelp PA-C Entered By: Lenda Kelp on 02/19/2021 16:02:45 -------------------------------------------------------------------------------- Physical Exam Details Patient Name: Date of Service: TAMARI, SHILLINGLAW 02/17/2021 9:15 A M Medical Record Number: [id number] Patient Account Number: [id number] Date of Birth/Sex: Treating RN: 27-Apr-1951 (70 y.o. [name] Primary Care Provider: [name] Other Clinician: Referring Provider: Treating Provider/Extender: Lenda Kelp Ngetich, Dinah Weeks in Treatment: 7 Constitutional Well-nourished and well-hydrated in no acute distress. Respiratory normal breathing without difficulty. Psychiatric this patient is able to make decisions and demonstrates good insight into disease process. Alert and Oriented x 3. pleasant and cooperative. Notes Patient's wound bed showed signs of good granulation  epithelization at this point. Fortunately there does not appear to be any evidence of infection which is great and overall very pleased in that regard. Overall I think the patient is making excellent progress in general here. Electronic Signature(s) Signed: 02/19/2021 4:03:04 PM By: Lenda Kelp PA-C Entered By: Lenda Kelp on 02/19/2021 16:03:04 -------------------------------------------------------------------------------- Physician Orders Details Patient Name: Date of Service: VERLEAN, NAVIDAD 02/17/2021 9:15 A M Medical Record Number: [id number] Patient Account Number: [id number] Date of Birth/Sex: Treating RN: March 09, 1951 (70 y.o. [name] Primary Care Provider: [name] Other Clinician: Referring Provider: Treating Provider/Extender: Erie Noe, Dinah Weeks in Treatment: 7 Verbal / Phone Orders: No Diagnosis Coding ICD-10 Coding Code Description I87.2 Venous insufficiency (chronic) (peripheral) I89.0 Lymphedema, not elsewhere classified L97.812 Non-pressure chronic ulcer of other part of right lower leg with fat layer exposed I10 Essential (primary) hypertension Follow-up Appointments ppointment in 1 week. - with Leonard Schwartz Return A Bathing/ Shower/ Hygiene May shower with protection but do not get wound dressing(s) wet. Edema Control - Lymphedema / SCD / Other Elevate legs to the level of the heart or above for 30 minutes daily and/or when sitting, a frequency of: Avoid standing for long periods of time. Exercise regularly Additional Orders / Instructions Follow Nutritious Diet Wound Treatment Wound #1 - Lower Leg Wound Laterality: Right, Medial Cleanser: Soap and Water 1 x Per Week/15 Days Discharge Instructions: May shower and wash wound with dial antibacterial soap and water prior to dressing change. Cleanser: Wound Cleanser 1 x Per Week/15 Days Discharge Instructions: Cleanse the wound with wound cleanser prior to applying a clean dressing  using gauze sponges, not tissue or cotton balls. Peri-Wound Care: Sween Lotion (Moisturizing lotion) 1 x Per Week/15 Days Discharge Instructions: Apply moisturizing lotion to leg Prim  Dressing: Promogran Prisma Matrix, 4.34 (sq in) (silver collagen) 1 x Per Week/15 Days ary Discharge Instructions: Moisten collagen with saline or hydrogel Secondary Dressing: Woven Gauze Sponge, Non-Sterile 4x4 in 1 x Per Week/15 Days Discharge Instructions: Apply over primary dressing as directed. Secondary Dressing: Woven Gauze Sponges 2x2 in 1 x Per Week/15 Days Discharge Instructions: pack lightly behind collagen to hold it in base of wound Compression Wrap: CoFlex TLC XL 2-layer Compression System 4x7 (in/yd) 1 x Per Week/15 Days Discharge Instructions: Apply CoFlex 2-layer compression as directed. (alt for 4 layer) Electronic Signature(s) Signed: 02/17/2021 5:54:54 PM By: Shawn Stall RN, BSN Signed: 02/19/2021 4:42:31 PM By: Lenda Kelp PA-C Entered By: Shawn Stall on 02/17/2021 10:21:41 -------------------------------------------------------------------------------- Problem List Details Patient Name: Date of Service: DONETTA, ISAZA 02/17/2021 9:15 A M Medical Record Number: [id number] Patient Account Number: [id number] Date of Birth/Sex: Treating RN: 01/13/1951 (70 y.o. [name] Primary Care Provider: [name] Other Clinician: Referring Provider: Treating Provider/Extender: Erie Noe, Dinah Weeks in Treatment: 7 Active Problems ICD-10 Encounter Code Description Active Date MDM Diagnosis I87.2 Venous insufficiency (chronic) (peripheral) 12/28/2020 No Yes I89.0 Lymphedema, not elsewhere classified 12/28/2020 No Yes L97.812 Non-pressure chronic ulcer of other part of right lower leg with fat layer 12/28/2020 No Yes exposed I10 Essential (primary) hypertension 12/28/2020 No Yes Inactive Problems Resolved Problems Electronic Signature(s) Signed: 02/17/2021 9:37:27  AM By: Lenda Kelp PA-C Entered By: Lenda Kelp on 02/17/2021 09:37:27 -------------------------------------------------------------------------------- Progress Note Details Patient Name: Date of Service: KEIAIRA, DONLAN 02/17/2021 9:15 A M Medical Record Number: [id number] Patient Account Number: [id number] Date of Birth/Sex: Treating RN: 1950/05/31 (70 y.o. [name] Primary Care Provider: [name] Other Clinician: Referring Provider: Treating Provider/Extender: Erie Noe, Dinah Weeks in Treatment: 7 Subjective Chief Complaint Information obtained from Patient Right leg ulcer following dog scratch June 2022 History of Present Illness (HPI) 12/28/2020 upon evaluation today patient presents for initial evaluation here in our clinic concerning a wound that she has over the right medial lower leg. This is subsequently a result of initially a trauma from her dog where he jumped up on her and caused a laceration. With that being said unfortunately the patient has been having issues with this since that time trying to get this healed. Specifically the initial injury occurred to November 16, 2020. The sutures did seem to be his around 12/07/2020 she was placed on doxycycline. Following that time she has been using Xeroform and a border gauze dressing currently which again is keeping it covered but that is about it. With regard to medical history the patient does have what appears to be an obvious history of chronic venous insufficiency and lymphedema. She also has high blood pressure noted today.. 01/06/2021 upon evaluation today patient appears to be doing well with regard to her wound. This actually appears to be significantly improved as compared to last week. She has been tolerating the dressing changes. In fact there really does not appear to be anything Require sharp debridement today as she is truly doing so well. She does not necessarily like the compression  wrap which to be honest I completely understand but nonetheless I do believe its been beneficial as well as far as getting this wound to heal appropriately. 01/13/2021 upon evaluation today patient appears to be doing well with regard to her leg ulcer. She has been tolerating the dressing changes without complication. Hydrofera Blue was done excellent job up to this point. With  that being said I do not believe that we are seeing any signs of worsening and in fact everything appears to be significantly improved compared to previous. 01/20/2021 upon evaluation today patient appears to be doing much better in regard to her wound. This is actually showing signs of significant improvement which is also news. I do not see any evidence of active infection which is also great and in general I think that we are headed in the appropriate direction. 01/27/2021 upon evaluation today patient appears to be doing well with regard to her wound. She has been tolerating the dressing changes without complication. Fortunately there does not appear to be any signs of active infection which is great news and overall I am extremely pleased with where we stand today. No fevers, chills, nausea, vomiting, or diarrhea. 02/03/2021 upon evaluation today patient appears to be doing well with regard to her wound. She has been tolerating the dressing changes without complication. Fortunately there does not appear to be any evidence of active infection which is great news and overall I am extremely pleased with where we stand today. No fevers, chills, nausea, vomiting, or diarrhea. 02/10/2021 upon evaluation today patient's wound is actually showing signs of good improvement. Everything is a healed around the edge and mainly what were dealing with currently is simply the main issue with the primary/deeper region where she did have again in the beginning a much deeper hole. We been using the Hydrofera Blue tucked into this area but I think  its not really allowing it to healing as effectively as I would like to see. I think we may want to change to something a little different today. 02/17/2021 upon evaluation today patient appears to be doing well with regard to her wound. She has been tolerating the dressing changes without complication. Good news is there does not appear to be any signs of infection currently the bad news is this is still taking much longer than we were hoping to get this small tunnel to fill-in. Objective Constitutional Well-nourished and well-hydrated in no acute distress. Vitals Time Taken: 9:30 AM, Height: 69 in, Weight: 250 lbs, BMI: 36.9, Temperature: 97.5 F, Pulse: 90 bpm, Respiratory Rate: 20 breaths/min, Blood Pressure: 141/80 mmHg. Respiratory normal breathing without difficulty. Psychiatric this patient is able to make decisions and demonstrates good insight into disease process. Alert and Oriented x 3. pleasant and cooperative. General Notes: Patient's wound bed showed signs of good granulation epithelization at this point. Fortunately there does not appear to be any evidence of infection which is great and overall very pleased in that regard. Overall I think the patient is making excellent progress in general here. Integumentary (Hair, Skin) Wound #1 status is Open. Original cause of wound was Trauma. The date acquired was: 09/21/2020. The wound has been in treatment 7 weeks. The wound is located on the Right,Medial Lower Leg. The wound measures 0.2cm length x 0.4cm width x 0.6cm depth; 0.063cm^2 area and 0.038cm^3 volume. There is Fat Layer (Subcutaneous Tissue) exposed. There is no undermining noted, however, there is tunneling at 12:00 with a maximum distance of 1.3cm. There is a small amount of serosanguineous drainage noted. The wound margin is distinct with the outline attached to the wound base. There is large (67-100%) red granulation within the wound bed. There is no necrotic tissue within  the wound bed. Assessment Active Problems ICD-10 Venous insufficiency (chronic) (peripheral) Lymphedema, not elsewhere classified Non-pressure chronic ulcer of other part of right lower leg with fat layer  exposed Essential (primary) hypertension Procedures Wound #1 Pre-procedure diagnosis of Wound #1 is a Lymphedema located on the Right,Medial Lower Leg . There was a Double Layer Compression Therapy Procedure by Shawn Stall, RN. Post procedure Diagnosis Wound #1: Same as Pre-Procedure Plan Follow-up Appointments: Return Appointment in 1 week. - with Luana Shu Shower/ Hygiene: May shower with protection but do not get wound dressing(s) wet. Edema Control - Lymphedema / SCD / Other: Elevate legs to the level of the heart or above for 30 minutes daily and/or when sitting, a frequency of: Avoid standing for long periods of time. Exercise regularly Additional Orders / Instructions: Follow Nutritious Diet WOUND #1: - Lower Leg Wound Laterality: Right, Medial Cleanser: Soap and Water 1 x Per Week/15 Days Discharge Instructions: May shower and wash wound with dial antibacterial soap and water prior to dressing change. Cleanser: Wound Cleanser 1 x Per Week/15 Days Discharge Instructions: Cleanse the wound with wound cleanser prior to applying a clean dressing using gauze sponges, not tissue or cotton balls. Peri-Wound Care: Sween Lotion (Moisturizing lotion) 1 x Per Week/15 Days Discharge Instructions: Apply moisturizing lotion to leg Prim Dressing: Promogran Prisma Matrix, 4.34 (sq in) (silver collagen) 1 x Per Week/15 Days ary Discharge Instructions: Moisten collagen with saline or hydrogel Secondary Dressing: Woven Gauze Sponge, Non-Sterile 4x4 in 1 x Per Week/15 Days Discharge Instructions: Apply over primary dressing as directed. Secondary Dressing: Woven Gauze Sponges 2x2 in 1 x Per Week/15 Days Discharge Instructions: pack lightly behind collagen to hold it in base of  wound Com pression Wrap: CoFlex TLC XL 2-layer Compression System 4x7 (in/yd) 1 x Per Week/15 Days Discharge Instructions: Apply CoFlex 2-layer compression as directed. (alt for 4 layer) 1. Would recommend currently that we go ahead and initiate treatment with a continuation of the wound care measures as before. Specifically this is the silver collagen dressing which I think is doing a great job for her. 2. Muscle can recommend that we continue with the Coflex 2 layer compression wrap. 3. I would also suggest that she continue to elevate her legs much as possible to help with edema control. We will see patient back for reevaluation in 1 week here in the clinic. If anything worsens or changes patient will contact our office for additional recommendations. Electronic Signature(s) Signed: 02/19/2021 4:03:35 PM By: Lenda Kelp PA-C Entered By: Lenda Kelp on 02/19/2021 16:03:35 -------------------------------------------------------------------------------- SuperBill Details Patient Name: Date of Service: DANAYA, GEDDIS 02/17/2021 Medical Record Number: [id number] Patient Account Number: [id number] Date of Birth/Sex: Treating RN: 1950/08/25 (70 y.o. [name] Primary Care Provider: [name] Other Clinician: Referring Provider: Treating Provider/Extender: Erie Noe, Dinah Weeks in Treatment: 7 Diagnosis Coding ICD-10 Codes Code Description I87.2 Venous insufficiency (chronic) (peripheral) I89.0 Lymphedema, not elsewhere classified L97.812 Non-pressure chronic ulcer of other part of right lower leg with fat layer exposed I10 Essential (primary) hypertension Facility Procedures CPT4 Code: [id number] Description: (Facility Use Only) [id number] - APPLY MULTLAY COMPRS LWR RT LEG Modifier: Quantity: 1 Physician Procedures : CPT4 Code Description Modifier [id number] - WC PHYS LEVEL 4 - EST PT ICD-10 Diagnosis Description I87.2 Venous insufficiency (chronic)  (peripheral) I89.0 Lymphedema, not elsewhere classified L97.812 Non-pressure chronic ulcer of other part of right  lower leg with fat layer exposed I10 Essential (primary) hypertension Quantity: 1 Electronic Signature(s) Signed: 02/19/2021 4:03:52 PM By: Lenda Kelp PA-C Previous Signature: 02/17/2021 5:54:54 PM Version By: Shawn Stall RN, BSN Entered By: Lenda Kelp on 02/19/2021 16:03:52

## 2021-02-17 NOTE — Progress Notes (Signed)
XITLALLI, NEWHARD ([id number]) Visit Report for 02/17/2021 Arrival Information Details Patient Name: Date of Service: RAIN, WILHIDE 02/17/2021 9:15 A M Medical Record Number: [id number] Patient Account Number: [id number] Date of Birth/Sex: Treating RN: [birth year] (70 y.o. Helene Shoe, Meta.Reding Primary Care Kortlyn Koltz: Marlowe Sax Other Clinician: Referring Ivyonna Hoelzel: Treating Hien Perreira/Extender: Tyron Russell, Dinah Weeks in Treatment: 7 Visit Information History Since Last Visit Added or deleted any medications: No Patient Arrived: Ambulatory Any new allergies or adverse reactions: No Arrival Time: 09:30 Had a fall or experienced change in No Accompanied By: self activities of daily living that may affect Transfer Assistance: None risk of falls: Patient Identification Verified: Yes Signs or symptoms of abuse/neglect since last visito No Secondary Verification Process Completed: Yes Hospitalized since last visit: No Patient Requires Transmission-Based Precautions: No Implantable device outside of the clinic excluding No Patient Has Alerts: No cellular tissue based products placed in the center since last visit: Has Dressing in Place as Prescribed: Yes Has Compression in Place as Prescribed: Yes Pain Present Now: No Electronic Signature(s) Signed: 02/17/2021 5:54:54 PM By: Deon Pilling RN, BSN Entered By: Deon Pilling on 02/17/2021 09:30:28 -------------------------------------------------------------------------------- Compression Therapy Details Patient Name: Date of Service: COURTENY, EGLER 02/17/2021 9:15 A M Medical Record Number: [id number] Patient Account Number: [id number] Date of Birth/Sex: Treating RN: 01/31/1951 (70 y.o. Debby Bud Primary Care Alayia Meggison: Marlowe Sax Other Clinician: Referring Koury Roddy: Treating Abbie Jablon/Extender: Worthy Keeler Ngetich, Dinah Weeks in Treatment: 7 Compression Therapy Performed for Wound Assessment: Wound #1  Right,Medial Lower Leg Performed By: Clinician Deon Pilling, RN Compression Type: Double Layer Post Procedure Diagnosis Same as Pre-procedure Electronic Signature(s) Signed: 02/17/2021 5:54:54 PM By: Deon Pilling RN, BSN Entered By: Deon Pilling on 02/17/2021 10:21:23 -------------------------------------------------------------------------------- Encounter Discharge Information Details Patient Name: Date of Service: SEMAJ, KHAM 02/17/2021 9:15 A M Medical Record Number: [id number] Patient Account Number: [id number] Date of Birth/Sex: Treating RN: [birth year] (70 y.o. Debby Bud Primary Care Dorotea Hand: Marlowe Sax Other Clinician: Referring Derrill Bagnell: Treating Destynee Stringfellow/Extender: Tyron Russell, Dinah Weeks in Treatment: 7 Encounter Discharge Information Items Discharge Condition: Stable Ambulatory Status: Ambulatory Discharge Destination: Home Transportation: Private Auto Accompanied By: self Schedule Follow-up Appointment: Yes Clinical Summary of Care: Electronic Signature(s) Signed: 02/17/2021 5:54:54 PM By: Deon Pilling RN, BSN Entered By: Deon Pilling on 02/17/2021 10:22:39 -------------------------------------------------------------------------------- Lower Extremity Assessment Details Patient Name: Date of Service: MANAIA, SAMAD 02/17/2021 9:15 A M Medical Record Number: [id number] Patient Account Number: [id number] Date of Birth/Sex: Treating RN: 10-05-1950 (70 y.o. Debby Bud Primary Care Floye Fesler: Marlowe Sax Other Clinician: Referring Anah Billard: Treating Tyrell Brereton/Extender: Worthy Keeler Ngetich, Dinah Weeks in Treatment: 7 Edema Assessment Assessed: [Left: No] [Right: Yes] Edema: [Left: Ye] [Right: s] Calf Left: Right: Point of Measurement: 29 cm From Medial Instep 45 cm Ankle Left: Right: Point of Measurement: 9 cm From Medial Instep 33 cm Vascular Assessment Pulses: Dorsalis Pedis Palpable:  [Right:Yes] Electronic Signature(s) Signed: 02/17/2021 5:54:54 PM By: Deon Pilling RN, BSN Entered By: Deon Pilling on 02/17/2021 09:34:00 -------------------------------------------------------------------------------- [place] Details Patient Name: Date of Service: MARRI, MCNEFF 02/17/2021 9:15 A M Medical Record Number: [id number] Patient Account Number: [id number] Date of Birth/Sex: Treating RN: 11-13-1950 (70 y.o. Debby Bud Primary Care Eliasar Hlavaty: Marlowe Sax Other Clinician: Referring Balraj Brayfield: Treating Villa Burgin/Extender: Tyron Russell, Dinah Weeks in Treatment: 7 Active Inactive Wound/Skin Impairment Nursing Diagnoses: Impaired tissue integrity Goals: Patient/caregiver will verbalize understanding of skin care regimen Date Initiated: 12/28/2020 Target Resolution Date: 03/19/2021  Goal Status: Active Ulcer/skin breakdown will have a volume reduction of 30% by week 4 Date Initiated: 12/28/2020 Date Inactivated: 01/27/2021 Target Resolution Date: 01/25/2021 Goal Status: Met Interventions: Assess patient/caregiver ability to obtain necessary supplies Assess patient/caregiver ability to perform ulcer/skin care regimen upon admission and as needed Assess ulceration(s) every visit Provide education on ulcer and skin care Treatment Activities: Topical wound management initiated : 12/28/2020 Notes: Electronic Signature(s) Signed: 02/17/2021 5:54:54 PM By: Deon Pilling RN, BSN Entered By: Deon Pilling on 02/17/2021 09:36:37 -------------------------------------------------------------------------------- Pain Assessment Details Patient Name: Date of Service: DENETRA, FORMOSO 02/17/2021 9:15 A M Medical Record Number: [id number] Patient Account Number: [id number] Date of Birth/Sex: Treating RN: 03-27-1951 (70 y.o. Debby Bud Primary Care Pleas Carneal: Marlowe Sax Other Clinician: Referring Maziah Keeling: Treating Keidra Withers/Extender:  Worthy Keeler Ngetich, Dinah Weeks in Treatment: 7 Active Problems Location of Pain Severity and Description of Pain Patient Has Paino No Site Locations Rate the pain. Rate the pain. Current Pain Level: 0 Pain Management and Medication Current Pain Management: Medication: No Cold Application: No Rest: No Massage: No Activity: No T.E.N.S.: No Heat Application: No Leg drop or elevation: No Is the Current Pain Management Adequate: Adequate How does your wound impact your activities of daily livingo Sleep: No Bathing: No Appetite: No Relationship With Others: No Bladder Continence: No Emotions: No Bowel Continence: No Work: No Toileting: No Drive: No Dressing: No Hobbies: No Engineer, maintenance) Signed: 02/17/2021 5:54:54 PM By: Deon Pilling RN, BSN Entered By: Deon Pilling on 02/17/2021 09:30:37 -------------------------------------------------------------------------------- Patient/Caregiver Education Details Patient Name: Date of Service: Lars Masson 9/28/2022andnbsp9:15 A M Medical Record Number: [id number] Patient Account Number: [id number] Date of Birth/Gender: Treating RN: [birth year] (70 y.o. Debby Bud Primary Care Physician: Marlowe Sax Other Clinician: Referring Physician: Treating Physician/Extender: Merlene Laughter in Treatment: 7 Education Assessment Education Provided To: Patient Education Topics Provided Wound/Skin Impairment: Handouts: Skin Care Do's and Dont's Methods: Explain/Verbal Responses: Reinforcements needed Electronic Signature(s) Signed: 02/17/2021 5:54:54 PM By: Deon Pilling RN, BSN Entered By: Deon Pilling on 02/17/2021 09:36:49 -------------------------------------------------------------------------------- Wound Assessment Details Patient Name: Date of Service: GEETA, DWORKIN 02/17/2021 9:15 A M Medical Record Number: [id number] Patient Account Number: [id number] Date of  Birth/Sex: Treating RN: 1950-10-25 (69 y.o. Helene Shoe, Meta.Reding Primary Care Leeroy Lovings: Marlowe Sax Other Clinician: Referring Alita Waldren: Treating Doneisha Ivey/Extender: Worthy Keeler Ngetich, Dinah Weeks in Treatment: 7 Wound Status Wound Number: 1 Primary Etiology: Lymphedema Wound Location: Right, Medial Lower Leg Wound Status: Open Wounding Event: Trauma Comorbid History: Hypertension, Osteoarthritis Date Acquired: 09/21/2020 Weeks Of Treatment: 7 Clustered Wound: No Photos Wound Measurements Length: (cm) 0.2 Width: (cm) 0.4 Depth: (cm) 0.6 Area: (cm) 0.063 Volume: (cm) 0.038 % Reduction in Area: 99.3% % Reduction in Volume: 98.6% Epithelialization: Small (1-33%) Tunneling: Yes Position (o'clock): 12 Maximum Distance: (cm) 1.3 Undermining: No Wound Description Classification: Full Thickness Without Exposed Support Structures Wound Margin: Distinct, outline attached Exudate Amount: Small Exudate Type: Serosanguineous Exudate Color: red, brown Foul Odor After Cleansing: No Slough/Fibrino No Wound Bed Granulation Amount: Large (67-100%) Exposed Structure Granulation Quality: Red Fascia Exposed: No Necrotic Amount: None Present (0%) Fat Layer (Subcutaneous Tissue) Exposed: Yes Tendon Exposed: No Muscle Exposed: No Joint Exposed: No Bone Exposed: No Treatment Notes Wound #1 (Lower Leg) Wound Laterality: Right, Medial Cleanser Soap and Water Discharge Instruction: May shower and wash wound with dial antibacterial soap and water prior to dressing change. Wound Cleanser Discharge Instruction: Cleanse the wound with wound cleanser prior to applying a clean  dressing using gauze sponges, not tissue or cotton balls. Peri-Wound Care Sween Lotion (Moisturizing lotion) Discharge Instruction: Apply moisturizing lotion to leg Topical Primary Dressing Promogran Prisma Matrix, 4.34 (sq in) (silver collagen) Discharge Instruction: Moisten collagen with saline or  hydrogel Secondary Dressing Woven Gauze Sponge, Non-Sterile 4x4 in Discharge Instruction: Apply over primary dressing as directed. Woven Gauze Sponges 2x2 in Discharge Instruction: pack lightly behind collagen to hold it in base of wound Secured With Compression Wrap CoFlex TLC XL 2-layer Compression System 4x7 (in/yd) Discharge Instruction: Apply CoFlex 2-layer compression as directed. (alt for 4 layer) Compression Stockings Add-Ons Electronic Signature(s) Signed: 02/17/2021 5:34:22 PM By: Lorrin Jackson Signed: 02/17/2021 5:54:54 PM By: Deon Pilling RN, BSN Entered By: Lorrin Jackson on 02/17/2021 09:46:04 -------------------------------------------------------------------------------- [place] Details Patient Name: Date of Service: NAREH, MATZKE 02/17/2021 9:15 A M Medical Record Number: [id number] Patient Account Number: [id number] Date of Birth/Sex: Treating RN: Oct 01, 1950 (70 y.o. Debby Bud Primary Care Theseus Birnie: Marlowe Sax Other Clinician: Referring Nitya Cauthon: Treating Bradyn Soward/Extender: Worthy Keeler Ngetich, Dinah Weeks in Treatment: 7 Vital Signs Time Taken: 09:30 Temperature (F): 97.5 Height (in): 69 Pulse (bpm): 90 Weight (lbs): 250 Respiratory Rate (breaths/min): 20 Body Mass Index (BMI): 36.9 Blood Pressure (mmHg): 141/80 Reference Range: 80 - 120 mg / dl Electronic Signature(s) Signed: 02/17/2021 5:54:54 PM By: Deon Pilling RN, BSN Entered By: Deon Pilling on 02/17/2021 09:32:45

## 2021-02-24 ENCOUNTER — Encounter (HOSPITAL_BASED_OUTPATIENT_CLINIC_OR_DEPARTMENT_OTHER): Payer: Medicare Other | Attending: Physician Assistant | Admitting: Physician Assistant

## 2021-02-24 ENCOUNTER — Other Ambulatory Visit: Payer: Self-pay

## 2021-02-24 DIAGNOSIS — L97819 Non-pressure chronic ulcer of other part of right lower leg with unspecified severity: Secondary | ICD-10-CM | POA: Diagnosis present

## 2021-02-24 DIAGNOSIS — I872 Venous insufficiency (chronic) (peripheral): Secondary | ICD-10-CM | POA: Diagnosis not present

## 2021-02-24 DIAGNOSIS — L97812 Non-pressure chronic ulcer of other part of right lower leg with fat layer exposed: Secondary | ICD-10-CM | POA: Diagnosis not present

## 2021-02-24 DIAGNOSIS — I890 Lymphedema, not elsewhere classified: Secondary | ICD-10-CM | POA: Diagnosis not present

## 2021-02-24 NOTE — Progress Notes (Addendum)
Tammy Fitzpatrick, Tammy Fitzpatrick ([id number]) Visit Report for 02/24/2021 Chief Complaint Document Details Tammy Fitzpatrick Name: Date of Service: [name], [name] 02/24/2021 1:00 PM Medical Record Number: [id number] Tammy Fitzpatrick Account Number: [id number] Date of Birth/Sex: Tammy Fitzpatrick: 11/15/1950 (70 y.o. Tammy Fitzpatrick: Tammy Fitzpatrick Other Clinician: Referring Fitzpatrick: Treating Fitzpatrick/Extender: Erie Noe, Dinah Weeks in Treatment: 8 Information Obtained from: Tammy Fitzpatrick Chief Complaint Right leg ulcer following dog scratch June 2022 Electronic Signature(s) Signed: 02/24/2021 1:25:49 PM By: Lenda Kelp PA-C Entered By: Lenda Kelp on 02/24/2021 13:25:49 -------------------------------------------------------------------------------- HPI Details Tammy Fitzpatrick Name: Date of Service: Tammy Fitzpatrick, Tammy Fitzpatrick 02/24/2021 1:00 PM Medical Record Number: [id number] Tammy Fitzpatrick Account Number: [id number] Date of Birth/Sex: Tammy Fitzpatrick: 07-01-1950 (70 y.o. Tammy Fitzpatrick: Tammy Fitzpatrick Other Clinician: Referring Fitzpatrick: Treating Fitzpatrick/Extender: Erie Noe, Dinah Weeks in Treatment: 8 History of Present Illness HPI Description: 12/28/2020 upon evaluation today Tammy Fitzpatrick presents for initial evaluation here in our clinic concerning a wound that she has over the right medial lower leg. This is subsequently a result of initially a trauma from her dog where he jumped up on her and caused a laceration. With that being said unfortunately the Tammy Fitzpatrick has been having issues with this since that time trying to get this healed. Specifically the initial injury occurred to November 16, 2020. The sutures did seem to be his around 12/07/2020 she was placed on doxycycline. Following that time she has been using Xeroform and a border gauze dressing currently which again is keeping it covered but that is about it. With regard to medical history the Tammy Fitzpatrick does have what  appears to be an obvious history of chronic venous insufficiency and lymphedema. She also has high blood pressure noted today.. 01/06/2021 upon evaluation today Tammy Fitzpatrick appears to be doing well with regard to her wound. This actually appears to be significantly improved as compared to last week. She has been tolerating the dressing changes. In fact there really does not appear to be anything Require sharp debridement today as she is truly doing so well. She does not necessarily like the compression wrap which to be honest I completely understand but nonetheless I do believe its been beneficial as well as far as getting this wound to heal appropriately. 01/13/2021 upon evaluation today Tammy Fitzpatrick appears to be doing well with regard to her leg ulcer. She has been tolerating the dressing changes without complication. Hydrofera Blue was done excellent job up to this point. With that being said I do not believe that we are seeing any signs of worsening and in fact everything appears to be significantly improved compared to previous. 01/20/2021 upon evaluation today Tammy Fitzpatrick appears to be doing much better in regard to her wound. This is actually showing signs of significant improvement which is also news. I do not see any evidence of active infection which is also great and in general I think that we are headed in the appropriate direction. 01/27/2021 upon evaluation today Tammy Fitzpatrick appears to be doing well with regard to her wound. She has been tolerating the dressing changes without complication. Fortunately there does not appear to be any signs of active infection which is great news and overall I am extremely pleased with where we stand today. No fevers, chills, nausea, vomiting, or diarrhea. 02/03/2021 upon evaluation today Tammy Fitzpatrick appears to be doing well with regard to her wound. She has been tolerating the dressing changes without complication. Fortunately there does not appear to be any evidence of active  infection which is great news and overall I am extremely pleased with where we stand today. No fevers, chills, nausea, vomiting, or diarrhea. 02/10/2021 upon evaluation today Tammy Fitzpatrick's wound is actually showing signs of good improvement. Everything is a healed around the edge and mainly what were dealing with currently is simply the main issue with the primary/deeper region where she did have again in the beginning a much deeper hole. We been using the Hydrofera Blue tucked into this area but I think its not really allowing it to healing as effectively as I would like to see. I think we may want to change to something a little different today. 02/17/2021 upon evaluation today Tammy Fitzpatrick appears to be doing well with regard to her wound. She has been tolerating the dressing changes without complication. Good news is there does not appear to be any signs of infection currently the bad news is this is still taking much longer than we were hoping to get this small tunnel to fill-in. 02/24/2021 upon evaluation today Tammy Fitzpatrick appears to be doing well with regard to her wound. Fortunately this is showing some signs of being a little bit tighter as far as the overall opening is concerned as compared to last week. This is great news. With that being said I do believe that we are headed in the right track. In fact I am not even certain we really need to pack anything down into the wound at this point I think just compression and putting something on the outside to catch any drainage is probably can be the optimal way to go. This was discussed with the Tammy Fitzpatrick today. Electronic Signature(s) Signed: 02/24/2021 1:36:56 PM By: Lenda Kelp PA-C Entered By: Lenda Kelp on 02/24/2021 13:36:55 -------------------------------------------------------------------------------- Physical Exam Details Tammy Fitzpatrick Name: Date of Service: Tammy Fitzpatrick, Tammy Fitzpatrick 02/24/2021 1:00 PM Medical Record Number: [id number] Tammy Fitzpatrick Account  Number: [id number] Date of Birth/Sex: Tammy Fitzpatrick: [birth year] (70 y.o. Tammy Fitzpatrick: Tammy Fitzpatrick Other Clinician: Referring Fitzpatrick: Treating Fitzpatrick/Extender: Lenda Kelp Ngetich, Dinah Weeks in Treatment: 8 Constitutional Well-nourished and well-hydrated in no acute distress. Respiratory normal breathing without difficulty. Psychiatric this Tammy Fitzpatrick is able to make decisions and demonstrates good insight into disease process. Alert and Oriented x 3. pleasant and cooperative. Notes Upon inspection Tammy Fitzpatrick's wound bed showed signs of good granulation epithelization at this point. Fortunately there does not appear to be any signs of infection which is great overall I think that we are headed in the appropriate direction. Electronic Signature(s) Signed: 02/24/2021 1:37:51 PM By: Lenda Kelp PA-C Entered By: Lenda Kelp on 02/24/2021 13:37:50 -------------------------------------------------------------------------------- Physician Orders Details Tammy Fitzpatrick Name: Date of Service: Tammy Fitzpatrick, Tammy Fitzpatrick 02/24/2021 1:00 PM Medical Record Number: [id number] Tammy Fitzpatrick Account Number: [id number] Date of Birth/Sex: Tammy Fitzpatrick: [birth year] (70 y.o. Tammy Fitzpatrick: Tammy Fitzpatrick Other Clinician: Referring Fitzpatrick: Treating Fitzpatrick/Extender: Erie Noe, Dinah Weeks in Treatment: 8 Verbal / Phone Orders: No Diagnosis Coding ICD-10 Coding Code Description I87.2 Venous insufficiency (chronic) (peripheral) I89.0 Lymphedema, not elsewhere classified L97.812 Non-pressure chronic ulcer of other part of right lower leg with fat layer exposed I10 Essential (primary) hypertension Follow-up Appointments ppointment in 2 weeks. - with Leonard Schwartz Return A Nurse Visit: - next week 10/12 Bathing/ Shower/ Hygiene May shower with protection but do not get wound dressing(s) wet. Edema Control - Lymphedema / SCD / Other Elevate legs to  the level of the heart or above for 30 minutes daily and/or when sitting,  a frequency of: Avoid standing for long periods of time. Exercise regularly Additional Orders / Instructions Follow Nutritious Diet Wound Treatment Wound #1 - Lower Leg Wound Laterality: Right, Medial Cleanser: Soap and Water 1 x Per Week/15 Days Discharge Instructions: May shower and wash wound with dial antibacterial soap and water prior to dressing change. Cleanser: Wound Cleanser 1 x Per Week/15 Days Discharge Instructions: Cleanse the wound with wound cleanser prior to applying a clean dressing using gauze sponges, not tissue or cotton balls. Peri-Wound Care: Triamcinolone 15 (g) 1 x Per Week/15 Days Discharge Instructions: Use triamcinolone 15 (g) as directed Prim Dressing: KerraCel Ag Gelling Fiber Dressing, 2x2 in (silver alginate) 1 x Per Week/15 Days ary Discharge Instructions: Apply silver alginate to wound bed tuck into opening only Secondary Dressing: Woven Gauze Sponge, Non-Sterile 4x4 in 1 x Per Week/15 Days Discharge Instructions: Apply over primary dressing as directed. Compression Wrap: CoFlex TLC XL 2-layer Compression System 4x7 (in/yd) 1 x Per Week/15 Days Discharge Instructions: Apply CoFlex 2-layer compression as directed. (alt for 4 layer) Electronic Signature(s) Signed: 02/25/2021 6:17:04 PM By: Zenaida Deed Fitzpatrick, BSN Signed: 02/26/2021 9:30:20 AM By: Lenda Kelp PA-C Entered By: Zenaida Deed on 02/24/2021 13:34:20 -------------------------------------------------------------------------------- Problem List Details Tammy Fitzpatrick Name: Date of Service: Tammy Fitzpatrick, Tammy Fitzpatrick 02/24/2021 1:00 PM Medical Record Number: [id number] Tammy Fitzpatrick Account Number: [id number] Date of Birth/Sex: Tammy Fitzpatrick: 1950/11/22 (70 y.o. Tammy Fitzpatrick: Tammy Fitzpatrick Other Clinician: Referring Fitzpatrick: Treating Fitzpatrick/Extender: Erie Noe, Dinah Weeks in Treatment:  8 Active Problems ICD-10 Encounter Code Description Active Date MDM Diagnosis I87.2 Venous insufficiency (chronic) (peripheral) 12/28/2020 No Yes I89.0 Lymphedema, not elsewhere classified 12/28/2020 No Yes L97.812 Non-pressure chronic ulcer of other part of right lower leg with fat layer 12/28/2020 No Yes exposed I10 Essential (primary) hypertension 12/28/2020 No Yes Inactive Problems Resolved Problems Electronic Signature(s) Signed: 02/24/2021 1:25:43 PM By: Lenda Kelp PA-C Entered By: Lenda Kelp on 02/24/2021 13:25:43 -------------------------------------------------------------------------------- Progress Note Details Tammy Fitzpatrick Name: Date of Service: Tammy Fitzpatrick, Tammy Fitzpatrick 02/24/2021 1:00 PM Medical Record Number: [id number] Tammy Fitzpatrick Account Number: [id number] Date of Birth/Sex: Tammy Fitzpatrick: November 19, 1950 (70 y.o. Tammy Fitzpatrick: Tammy Fitzpatrick Other Clinician: Referring Fitzpatrick: Treating Fitzpatrick/Extender: Erie Noe, Dinah Weeks in Treatment: 8 Subjective Chief Complaint Information obtained from Tammy Fitzpatrick Right leg ulcer following dog scratch June 2022 History of Present Illness (HPI) 12/28/2020 upon evaluation today Tammy Fitzpatrick presents for initial evaluation here in our clinic concerning a wound that she has over the right medial lower leg. This is subsequently a result of initially a trauma from her dog where he jumped up on her and caused a laceration. With that being said unfortunately the Tammy Fitzpatrick has been having issues with this since that time trying to get this healed. Specifically the initial injury occurred to November 16, 2020. The sutures did seem to be his around 12/07/2020 she was placed on doxycycline. Following that time she has been using Xeroform and a border gauze dressing currently which again is keeping it covered but that is about it. With regard to medical history the Tammy Fitzpatrick does have what appears to be an obvious history of chronic  venous insufficiency and lymphedema. She also has high blood pressure noted today.. 01/06/2021 upon evaluation today Tammy Fitzpatrick appears to be doing well with regard to her wound. This actually appears to be significantly improved as compared to last week. She has been tolerating the dressing changes. In fact there really does not appear to  be anything Require sharp debridement today as she is truly doing so well. She does not necessarily like the compression wrap which to be honest I completely understand but nonetheless I do believe its been beneficial as well as far as getting this wound to heal appropriately. 01/13/2021 upon evaluation today Tammy Fitzpatrick appears to be doing well with regard to her leg ulcer. She has been tolerating the dressing changes without complication. Hydrofera Blue was done excellent job up to this point. With that being said I do not believe that we are seeing any signs of worsening and in fact everything appears to be significantly improved compared to previous. 01/20/2021 upon evaluation today Tammy Fitzpatrick appears to be doing much better in regard to her wound. This is actually showing signs of significant improvement which is also news. I do not see any evidence of active infection which is also great and in general I think that we are headed in the appropriate direction. 01/27/2021 upon evaluation today Tammy Fitzpatrick appears to be doing well with regard to her wound. She has been tolerating the dressing changes without complication. Fortunately there does not appear to be any signs of active infection which is great news and overall I am extremely pleased with where we stand today. No fevers, chills, nausea, vomiting, or diarrhea. 02/03/2021 upon evaluation today Tammy Fitzpatrick appears to be doing well with regard to her wound. She has been tolerating the dressing changes without complication. Fortunately there does not appear to be any evidence of active infection which is great news and overall I am  extremely pleased with where we stand today. No fevers, chills, nausea, vomiting, or diarrhea. 02/10/2021 upon evaluation today Tammy Fitzpatrick's wound is actually showing signs of good improvement. Everything is a healed around the edge and mainly what were dealing with currently is simply the main issue with the primary/deeper region where she did have again in the beginning a much deeper hole. We been using the Hydrofera Blue tucked into this area but I think its not really allowing it to healing as effectively as I would like to see. I think we may want to change to something a little different today. 02/17/2021 upon evaluation today Tammy Fitzpatrick appears to be doing well with regard to her wound. She has been tolerating the dressing changes without complication. Good news is there does not appear to be any signs of infection currently the bad news is this is still taking much longer than we were hoping to get this small tunnel to fill-in. 02/24/2021 upon evaluation today Tammy Fitzpatrick appears to be doing well with regard to her wound. Fortunately this is showing some signs of being a little bit tighter as far as the overall opening is concerned as compared to last week. This is great news. With that being said I do believe that we are headed in the right track. In fact I am not even certain we really need to pack anything down into the wound at this point I think just compression and putting something on the outside to catch any drainage is probably can be the optimal way to go. This was discussed with the Tammy Fitzpatrick today. Objective Constitutional Well-nourished and well-hydrated in no acute distress. Vitals Time Taken: 1:08 PM, Height: 69 in, Weight: 250 lbs, BMI: 36.9, Temperature: 98.3 F, Pulse: 99 bpm, Respiratory Rate: 18 breaths/min, Blood Pressure: 161/83 mmHg. Respiratory normal breathing without difficulty. Psychiatric this Tammy Fitzpatrick is able to make decisions and demonstrates good insight into disease  process. Alert and Oriented x 3.  pleasant and cooperative. General Notes: Upon inspection Tammy Fitzpatrick's wound bed showed signs of good granulation epithelization at this point. Fortunately there does not appear to be any signs of infection which is great overall I think that we are headed in the appropriate direction. Integumentary (Hair, Skin) Wound #1 status is Open. Original cause of wound was Trauma. The date acquired was: 09/21/2020. The wound has been in treatment 8 weeks. The wound is located on the Right,Medial Lower Leg. The wound measures 0.2cm length x 0.3cm width x 0.6cm depth; 0.047cm^2 area and 0.028cm^3 volume. There is Fat Layer (Subcutaneous Tissue) exposed. There is no undermining noted, however, there is tunneling at 12:00 with a maximum distance of 1.5cm. There is a small amount of serosanguineous drainage noted. The wound margin is distinct with the outline attached to the wound base. There is large (67-100%) red granulation within the wound bed. There is no necrotic tissue within the wound bed. Assessment Active Problems ICD-10 Venous insufficiency (chronic) (peripheral) Lymphedema, not elsewhere classified Non-pressure chronic ulcer of other part of right lower leg with fat layer exposed Essential (primary) hypertension Procedures Wound #1 Pre-procedure diagnosis of Wound #1 is a Lymphedema located on the Right,Medial Lower Leg . There was a Double Layer Compression Therapy Procedure by Zenaida Deed, Fitzpatrick. Post procedure Diagnosis Wound #1: Same as Pre-Procedure Plan Follow-up Appointments: Return Appointment in 2 weeks. - with Leonard Schwartz Nurse Visit: - next week 10/12 Bathing/ Shower/ Hygiene: May shower with protection but do not get wound dressing(s) wet. Edema Control - Lymphedema / SCD / Other: Elevate legs to the level of the heart or above for 30 minutes daily and/or when sitting, a frequency of: Avoid standing for long periods of time. Exercise regularly Additional  Orders / Instructions: Follow Nutritious Diet WOUND #1: - Lower Leg Wound Laterality: Right, Medial Cleanser: Soap and Water 1 x Per Week/15 Days Discharge Instructions: May shower and wash wound with dial antibacterial soap and water prior to dressing change. Cleanser: Wound Cleanser 1 x Per Week/15 Days Discharge Instructions: Cleanse the wound with wound cleanser prior to applying a clean dressing using gauze sponges, not tissue or cotton balls. Peri-Wound Care: Triamcinolone 15 (g) 1 x Per Week/15 Days Discharge Instructions: Use triamcinolone 15 (g) as directed Prim Dressing: KerraCel Ag Gelling Fiber Dressing, 2x2 in (silver alginate) 1 x Per Week/15 Days ary Discharge Instructions: Apply silver alginate to wound bed tuck into opening only Secondary Dressing: Woven Gauze Sponge, Non-Sterile 4x4 in 1 x Per Week/15 Days Discharge Instructions: Apply over primary dressing as directed. Com pression Wrap: CoFlex TLC XL 2-layer Compression System 4x7 (in/yd) 1 x Per Week/15 Days Discharge Instructions: Apply CoFlex 2-layer compression as directed. (alt for 4 layer) 1. Would recommend currently that we going continue with the wound care measures as before and the Tammy Fitzpatrick is in agreement with the plan. This includes the use of the silver alginate dressing on the outside we will be packing anything into the tunnel I think it is getting so close to closure that packing anything will just disrupt any healing that is actually occurring at this point. 2. I Minna suggest we continue with the Coflex 2 layer compression wrap. 3. We will also have her continue to elevate her legs much as possible to help with edema control. We will see Tammy Fitzpatrick back for reevaluation in 2 weeks here in the clinic. If anything worsens or changes Tammy Fitzpatrick will contact our office for additional recommendations. Electronic Signature(s) Signed: 02/24/2021 1:38:30 PM By: Larina Bras  III, Tinley Rought PA-C Entered By: Lenda Kelp on  02/24/2021 13:38:30 -------------------------------------------------------------------------------- SuperBill Details Tammy Fitzpatrick Name: Date of Service: CONI, Tammy Fitzpatrick 02/24/2021 Medical Record Number: [id number] Tammy Fitzpatrick Account Number: [id number] Date of Birth/Sex: Tammy Fitzpatrick: 08/30/1950 (70 y.o. Tammy Fitzpatrick: Tammy Fitzpatrick Other Clinician: Referring Fitzpatrick: Treating Fitzpatrick/Extender: Erie Noe, Dinah Weeks in Treatment: 8 Diagnosis Coding ICD-10 Codes Code Description I87.2 Venous insufficiency (chronic) (peripheral) I89.0 Lymphedema, not elsewhere classified L97.812 Non-pressure chronic ulcer of other part of right lower leg with fat layer exposed I10 Essential (primary) hypertension Facility Procedures CPT4 Code: [id number] Description: (Facility Use Only) [phone] - APPLY MULTLAY COMPRS LWR RT LEG Modifier: Quantity: 1 Physician Procedures : CPT4 Code Description Modifier [id number] - WC PHYS LEVEL 3 - EST PT ICD-10 Diagnosis Description I87.2 Venous insufficiency (chronic) (peripheral) I89.0 Lymphedema, not elsewhere classified L97.812 Non-pressure chronic ulcer of other part of right  lower leg with fat layer exposed I10 Essential (primary) hypertension Quantity: 1 Electronic Signature(s) Signed: 02/25/2021 6:17:04 PM By: Zenaida Deed Fitzpatrick, BSN Signed: 02/26/2021 9:30:20 AM By: Lenda Kelp PA-C Previous Signature: 02/24/2021 1:38:41 PM Version By: Lenda Kelp PA-C Entered By: Zenaida Deed on 02/24/2021 13:44:26

## 2021-02-25 NOTE — Progress Notes (Signed)
Tammy Fitzpatrick, Tammy Fitzpatrick ([id number]) Visit Report for 02/24/2021 Arrival Information Details Patient Name: Date of Service: Tammy Fitzpatrick, Tammy Fitzpatrick 02/24/2021 1:00 PM Medical Record Number: [id number] Patient Account Number: [id number] Date of Birth/Sex: Treating RN: 05-Jan-1951 (70 y.o. Tammy Fitzpatrick Primary Care Hayle Parisi: Marlowe Sax Other Clinician: Referring Jahmari Esbenshade: Treating Ryder Man/Extender: Tyron Russell, Dinah Weeks in Treatment: 8 Visit Information History Since Last Visit Added or deleted any medications: No Patient Arrived: Ambulatory Any new allergies or adverse reactions: No Arrival Time: 13:06 Had a fall or experienced change in No Accompanied By: self activities of daily living that may affect Transfer Assistance: None risk of falls: Patient Identification Verified: Yes Signs or symptoms of abuse/neglect since last visito No Secondary Verification Process Completed: Yes Hospitalized since last visit: No Patient Requires Transmission-Based Precautions: No Implantable device outside of the clinic excluding No Patient Has Alerts: No cellular tissue based products placed in the center since last visit: Has Dressing in Place as Prescribed: Yes Has Compression in Place as Prescribed: Yes Pain Present Now: No Electronic Signature(s) Signed: 02/25/2021 6:17:04 PM By: Baruch Gouty RN, BSN Entered By: Baruch Gouty on 02/24/2021 13:08:01 -------------------------------------------------------------------------------- Compression Therapy Details Patient Name: Date of Service: Tammy Fitzpatrick, Tammy Fitzpatrick 02/24/2021 1:00 PM Medical Record Number: [id number] Patient Account Number: [id number] Date of Birth/Sex: Treating RN: [birth year] (70 y.o. Tammy Fitzpatrick Primary Care Tomekia Helton: Marlowe Sax Other Clinician: Referring Tahra Hitzeman: Treating Delene Morais/Extender: Tyron Russell, Dinah Weeks in Treatment: 8 Compression Therapy Performed for Wound Assessment:  Wound #1 Right,Medial Lower Leg Performed By: Clinician Baruch Gouty, RN Compression Type: Double Layer Post Procedure Diagnosis Same as Pre-procedure Electronic Signature(s) Signed: 02/25/2021 6:17:04 PM By: Baruch Gouty RN, BSN Entered By: Baruch Gouty on 02/24/2021 13:30:36 -------------------------------------------------------------------------------- Encounter Discharge Information Details Patient Name: Date of Service: Tammy Fitzpatrick, Tammy Fitzpatrick 02/24/2021 1:00 PM Medical Record Number: [id number] Patient Account Number: [id number] Date of Birth/Sex: Treating RN: May 18, 1951 (71 y.o. Tammy Fitzpatrick Primary Care Jaymarie Yeakel: Marlowe Sax Other Clinician: Referring Jakyrah Holladay: Treating Jesselle Laflamme/Extender: Tyron Russell, Dinah Weeks in Treatment: 8 Encounter Discharge Information Items Discharge Condition: Stable Ambulatory Status: Ambulatory Discharge Destination: Home Transportation: Private Auto Accompanied By: self Schedule Follow-up Appointment: Yes Clinical Summary of Care: Patient Declined Electronic Signature(s) Signed: 02/25/2021 6:17:04 PM By: Baruch Gouty RN, BSN Entered By: Baruch Gouty on 02/24/2021 13:44:04 -------------------------------------------------------------------------------- Lower Extremity Assessment Details Patient Name: Date of Service: Tammy Fitzpatrick, Tammy Fitzpatrick 02/24/2021 1:00 PM Medical Record Number: [id number] Patient Account Number: [id number] Date of Birth/Sex: Treating RN: April 23, 1951 (70 y.o. Tammy Fitzpatrick Primary Care Kimiye Strathman: Marlowe Sax Other Clinician: Referring Cheyan Frees: Treating Corwyn Vora/Extender: Worthy Keeler Ngetich, Dinah Weeks in Treatment: 8 Edema Assessment Assessed: [Left: No] [Right: No] Edema: [Left: Ye] [Right: s] Calf Left: Right: Point of Measurement: 29 cm From Medial Instep 47.5 cm Ankle Left: Right: Point of Measurement: 9 cm From Medial Instep 30.5 cm Vascular  Assessment Pulses: Dorsalis Pedis Palpable: [Right:Yes] Electronic Signature(s) Signed: 02/25/2021 6:17:04 PM By: Baruch Gouty RN, BSN Entered By: Baruch Gouty on 02/24/2021 13:15:01 -------------------------------------------------------------------------------- Multi-Disciplinary Care Plan Details Patient Name: Date of Service: Tammy Fitzpatrick, Tammy Fitzpatrick 02/24/2021 1:00 PM Medical Record Number: [id number] Patient Account Number: [id number] Date of Birth/Sex: Treating RN: 05-23-1951 (70 y.o. Tammy Fitzpatrick Primary Care Hasnain Manheim: Marlowe Sax Other Clinician: Referring Jenissa Tyrell: Treating Harris Penton/Extender: Tyron Russell, Dinah Weeks in Treatment: 8 Active Inactive Wound/Skin Impairment Nursing Diagnoses: Impaired tissue integrity Goals: Patient/caregiver will verbalize understanding of skin care regimen Date Initiated: 12/28/2020 Target Resolution Date: 03/19/2021 Goal Status: Active  Ulcer/skin breakdown will have a volume reduction of 30% by week 4 Date Initiated: 12/28/2020 Date Inactivated: 01/27/2021 Target Resolution Date: 01/25/2021 Goal Status: Met Interventions: Assess patient/caregiver ability to obtain necessary supplies Assess patient/caregiver ability to perform ulcer/skin care regimen upon admission and as needed Assess ulceration(s) every visit Provide education on ulcer and skin care Treatment Activities: Topical wound management initiated : 12/28/2020 Notes: Electronic Signature(s) Signed: 02/25/2021 6:17:04 PM By: Baruch Gouty RN, BSN Entered By: Baruch Gouty on 02/24/2021 13:21:05 -------------------------------------------------------------------------------- Pain Assessment Details Patient Name: Date of Service: Tammy Fitzpatrick, Tammy Fitzpatrick 02/24/2021 1:00 PM Medical Record Number: [id number] Patient Account Number: [id number] Date of Birth/Sex: Treating RN: 12-19-1950 (70 y.o. Tammy Fitzpatrick Primary Care Kaedan Richert: Marlowe Sax Other  Clinician: Referring Briella Hobday: Treating Tayllor Breitenstein/Extender: Tyron Russell, Dinah Weeks in Treatment: 8 Active Problems Location of Pain Severity and Description of Pain Patient Has Paino No Site Locations Rate the pain. Rate the pain. Current Pain Level: 0 Pain Management and Medication Current Pain Management: Electronic Signature(s) Signed: 02/25/2021 6:17:04 PM By: Baruch Gouty RN, BSN Entered By: Baruch Gouty on 02/24/2021 13:10:48 -------------------------------------------------------------------------------- Patient/Caregiver Education Details Patient Name: Date of Service: Tammy Fitzpatrick 10/5/2022andnbsp1:00 PM Medical Record Number: [id number] Patient Account Number: [id number] Date of Birth/Gender: Treating RN: [birth year] (70 y.o. Tammy Fitzpatrick Primary Care Physician: Marlowe Sax Other Clinician: Referring Physician: Treating Physician/Extender: Merlene Laughter in Treatment: 8 Education Assessment Education Provided To: Patient Education Topics Provided Wound/Skin Impairment: Methods: Explain/Verbal Responses: Reinforcements needed, State content correctly Electronic Signature(s) Signed: 02/25/2021 6:17:04 PM By: Baruch Gouty RN, BSN Entered By: Baruch Gouty on 02/24/2021 13:21:57 -------------------------------------------------------------------------------- Wound Assessment Details Patient Name: Date of Service: Tammy Fitzpatrick, Tammy Fitzpatrick 02/24/2021 1:00 PM Medical Record Number: [id number] Patient Account Number: [id number] Date of Birth/Sex: Treating RN: 05/27/1950 (70 y.o. Tammy Fitzpatrick Primary Care Amar Keenum: Marlowe Sax Other Clinician: Referring Michella Detjen: Treating Careem Yasui/Extender: Worthy Keeler Ngetich, Dinah Weeks in Treatment: 8 Wound Status Wound Number: 1 Primary Etiology: Lymphedema Wound Location: Right, Medial Lower Leg Wound Status: Open Wounding Event: Trauma Comorbid History:  Hypertension, Osteoarthritis Date Acquired: 09/21/2020 Weeks Of Treatment: 8 Clustered Wound: No Photos Wound Measurements Length: (cm) 0.2 Width: (cm) 0.3 Depth: (cm) 0.6 Area: (cm) 0.047 Volume: (cm) 0.028 % Reduction in Area: 99.5% % Reduction in Volume: 98.9% Epithelialization: Medium (34-66%) Tunneling: Yes Position (o'clock): 12 Maximum Distance: (cm) 1.5 Undermining: No Wound Description Classification: Full Thickness Without Exposed Support Structures Wound Margin: Distinct, outline attached Exudate Amount: Small Exudate Type: Serosanguineous Exudate Color: red, brown Foul Odor After Cleansing: No Slough/Fibrino No Wound Bed Granulation Amount: Large (67-100%) Exposed Structure Granulation Quality: Red Fascia Exposed: No Necrotic Amount: None Present (0%) Fat Layer (Subcutaneous Tissue) Exposed: Yes Tendon Exposed: No Muscle Exposed: No Joint Exposed: No Bone Exposed: No Treatment Notes Wound #1 (Lower Leg) Wound Laterality: Right, Medial Cleanser Soap and Water Discharge Instruction: May shower and wash wound with dial antibacterial soap and water prior to dressing change. Wound Cleanser Discharge Instruction: Cleanse the wound with wound cleanser prior to applying a clean dressing using gauze sponges, not tissue or cotton balls. Peri-Wound Care Triamcinolone 15 (g) Discharge Instruction: Use triamcinolone 15 (g) as directed Topical Primary Dressing KerraCel Ag Gelling Fiber Dressing, 2x2 in (silver alginate) Discharge Instruction: Apply silver alginate to wound bed tuck into opening only Secondary Dressing Woven Gauze Sponge, Non-Sterile 4x4 in Discharge Instruction: Apply over primary dressing as directed. Secured With Compression Wrap CoFlex TLC XL 2-layer Compression System 4x7 (in/yd) Discharge  Instruction: Apply CoFlex 2-layer compression as directed. (alt for 4 layer) Compression Stockings Add-Ons Electronic Signature(s) Signed: 02/25/2021  6:17:04 PM By: Baruch Gouty RN, BSN Entered By: Baruch Gouty on 02/24/2021 13:21:40 -------------------------------------------------------------------------------- Vitals Details Patient Name: Date of Service: Tammy Fitzpatrick, Tammy Fitzpatrick 02/24/2021 1:00 PM Medical Record Number: [id number] Patient Account Number: [id number] Date of Birth/Sex: Treating RN: 1951/04/13 (70 y.o. Tammy Fitzpatrick Primary Care Amelia Macken: Marlowe Sax Other Clinician: Referring Lawrie Tunks: Treating Lucero Ide/Extender: Tyron Russell, Dinah Weeks in Treatment: 8 Vital Signs Time Taken: 13:08 Temperature (F): 98.3 Height (in): 69 Pulse (bpm): 99 Weight (lbs): 250 Respiratory Rate (breaths/min): 18 Body Mass Index (BMI): 36.9 Blood Pressure (mmHg): 161/83 Reference Range: 80 - 120 mg / dl Electronic Signature(s) Signed: 02/25/2021 6:17:04 PM By: Baruch Gouty RN, BSN Entered By: Baruch Gouty on 02/24/2021 13:10:40

## 2021-03-03 ENCOUNTER — Encounter (HOSPITAL_BASED_OUTPATIENT_CLINIC_OR_DEPARTMENT_OTHER): Payer: Medicare Other | Admitting: Internal Medicine

## 2021-03-03 ENCOUNTER — Other Ambulatory Visit: Payer: Self-pay

## 2021-03-03 DIAGNOSIS — L97812 Non-pressure chronic ulcer of other part of right lower leg with fat layer exposed: Secondary | ICD-10-CM | POA: Diagnosis not present

## 2021-03-03 NOTE — Progress Notes (Signed)
Tammy, Fitzpatrick ([id number]) Visit Report for 03/03/2021 Arrival Information Details Patient Name: Date of Service: Tammy, Fitzpatrick 03/03/2021 12:30 PM Medical Record Number: [id number] Patient Account Number: [id number] Date of Birth/Sex: Treating RN: September 10, 1950 (70 y.o. Tammy Fitzpatrick Primary Care Amandalee Lacap: [name] Other Clinician: Referring Lorrie Strauch: Treating Trude Cansler/Extender: Georgiann Hahn in Treatment: 9 Visit Information History Since Last Visit Added or deleted any medications: No Patient Arrived: Ambulatory Any new allergies or adverse reactions: No Arrival Time: 12:48 Had a fall or experienced change in No Accompanied By: self activities of daily living that may affect Transfer Assistance: None risk of falls: Patient Identification Verified: Yes Signs or symptoms of abuse/neglect since last visito No Secondary Verification Process Completed: Yes Hospitalized since last visit: No Patient Requires Transmission-Based Precautions: No Implantable device outside of the clinic excluding No Patient Has Alerts: No cellular tissue based products placed in the center since last visit: Has Dressing in Place as Prescribed: Yes Has Compression in Place as Prescribed: Yes Pain Present Now: No Electronic Signature(s) Signed: 03/03/2021 4:40:08 PM By: Zenaida Deed RN, BSN Entered By: Zenaida Deed on 03/03/2021 12:49:30 -------------------------------------------------------------------------------- Compression Therapy Details Patient Name: Date of Service: Tammy Fitzpatrick 03/03/2021 12:30 PM Medical Record Number: [id number] Patient Account Number: [id number] Date of Birth/Sex: Treating RN: [birth year] (70 y.o. Tammy Fitzpatrick Primary Care Gaylin Osoria: [name] Other Clinician: Referring Eli Adami: Treating Aubriella Perezgarcia/Extender: Georgiann Hahn in Treatment: 9 Compression Therapy Performed for Wound  Assessment: Wound #1 Right,Medial Lower Leg Performed By: Clinician Zenaida Deed, RN Compression Type: Double Layer Electronic Signature(s) Signed: 03/03/2021 4:40:08 PM By: Zenaida Deed RN, BSN Entered By: Zenaida Deed on 03/03/2021 13:02:28 -------------------------------------------------------------------------------- Encounter Discharge Information Details Patient Name: Date of Service: Tammy, Fitzpatrick 03/03/2021 12:30 PM Medical Record Number: [id number] Patient Account Number: [id number] Date of Birth/Sex: Treating RN: Feb 02, 1951 (70 y.o. Tammy Fitzpatrick Primary Care Greidy Sherard: [name] Other Clinician: Referring Tremain Rucinski: Treating Erikka Follmer/Extender: Georgiann Hahn in Treatment: 9 Encounter Discharge Information Items Discharge Condition: Stable Ambulatory Status: Ambulatory Discharge Destination: Home Transportation: Private Auto Accompanied By: self Schedule Follow-up Appointment: Yes Clinical Summary of Care: Patient Declined Electronic Signature(s) Signed: 03/03/2021 4:40:08 PM By: Zenaida Deed RN, BSN Entered By: Zenaida Deed on 03/03/2021 13:03:09 -------------------------------------------------------------------------------- Patient/Caregiver Education Details Patient Name: Date of Service: Tammy Fitzpatrick 10/12/2022andnbsp12:30 PM Medical Record Number: [id number] Patient Account Number: [id number] Date of Birth/Gender: Treating RN: 1950/07/13 (70 y.o. Tammy Fitzpatrick Primary Care Physician: [name] Other Clinician: Referring Physician: Treating Physician/Extender: Georgiann Hahn in Treatment: 9 Education Assessment Education Provided To: Patient Education Topics Provided Venous: Methods: Explain/Verbal Responses: Reinforcements needed, State content correctly Electronic Signature(s) Signed: 03/03/2021 4:40:08 PM By: Zenaida Deed RN, BSN Entered By: Zenaida Deed  on 03/03/2021 13:02:55 -------------------------------------------------------------------------------- Wound Assessment Details Patient Name: Date of Service: Tammy, Fitzpatrick 03/03/2021 12:30 PM Medical Record Number: [id number] Patient Account Number: [id number] Date of Birth/Sex: Treating RN: July 14, 1950 (70 y.o. Tammy Fitzpatrick Primary Care Tsuneo Faison: [name] Other Clinician: Referring Shirlee Whitmire: Treating Jony Ladnier/Extender: Georgiann Hahn in Treatment: 9 Wound Status Wound Number: 1 Primary Etiology: Lymphedema Wound Location: Right, Medial Lower Leg Wound Status: Open Wounding Event: Trauma Comorbid History: Hypertension, Osteoarthritis Date Acquired: 09/21/2020 Weeks Of Treatment: 9 Clustered Wound: No Wound Measurements Length: (cm) 0.2 Width: (cm) 0.3 Depth: (cm) 0.6 Area: (cm) 0.047 Volume: (cm) 0.028 % Reduction in Area: 99.5% % Reduction in Volume: 98.9% Epithelialization: Medium (34-66%) Tunneling: No Undermining: No Wound  Description Classification: Full Thickness Without Exposed Support Structures Wound Margin: Distinct, outline attached Exudate Amount: Small Exudate Type: Serosanguineous Exudate Color: red, brown Foul Odor After Cleansing: No Slough/Fibrino No Wound Bed Granulation Amount: Large (67-100%) Exposed Structure Granulation Quality: Red Fascia Exposed: No Necrotic Amount: None Present (0%) Fat Layer (Subcutaneous Tissue) Exposed: Yes Tendon Exposed: No Muscle Exposed: No Joint Exposed: No Bone Exposed: No Treatment Notes Wound #1 (Lower Leg) Wound Laterality: Right, Medial Cleanser Soap and Water Discharge Instruction: May shower and wash wound with dial antibacterial soap and water prior to dressing change. Wound Cleanser Discharge Instruction: Cleanse the wound with wound cleanser prior to applying a clean dressing using gauze sponges, not tissue or cotton balls. Peri-Wound Care Triamcinolone 15  (g) Discharge Instruction: Use triamcinolone 15 (g) as directed Topical Primary Dressing KerraCel Ag Gelling Fiber Dressing, 2x2 in (silver alginate) Discharge Instruction: Apply silver alginate to wound bed tuck into opening only Secondary Dressing Woven Gauze Sponge, Non-Sterile 4x4 in Discharge Instruction: Apply over primary dressing as directed. Secured With Compression Wrap CoFlex TLC XL 2-layer Compression System 4x7 (in/yd) Discharge Instruction: Apply CoFlex 2-layer compression as directed. (alt for 4 layer) Compression Stockings Add-Ons Electronic Signature(s) Signed: 03/03/2021 4:40:08 PM By: Zenaida Deed RN, BSN Entered By: Zenaida Deed on 03/03/2021 13:02:12 -------------------------------------------------------------------------------- Vitals Details Patient Name: Date of Service: Tammy, Fitzpatrick 03/03/2021 12:30 PM Medical Record Number: [id number] Patient Account Number: [id number] Date of Birth/Sex: Treating RN: [birth year] (70 y.o. Tammy Fitzpatrick Primary Care Isreal Moline: [name] Other Clinician: Referring Pattricia Weiher: Treating Hazyl Marseille/Extender: Georgiann Hahn in Treatment: 9 Vital Signs Time Taken: 12:49 Temperature (F): 98.3 Height (in): 69 Pulse (bpm): 86 Source: Stated Respiratory Rate (breaths/min): 18 Weight (lbs): 250 Blood Pressure (mmHg): 145/86 Source: Stated Reference Range: 80 - 120 mg / dl Body Mass Index (BMI): 36.9 Electronic Signature(s) Signed: 03/03/2021 4:40:08 PM By: Zenaida Deed RN, BSN Entered By: Zenaida Deed on 03/03/2021 12:50:17

## 2021-03-03 NOTE — Progress Notes (Signed)
LIZZIE, AN ([id number]) Visit Report for 03/03/2021 SuperBill Details Patient Name: Date of Service: Tammy Fitzpatrick, FLAMING 03/03/2021 Medical Record Number: [id number] Patient Account Number: [id number] Date of Birth/Sex: Treating RN: 1951/01/13 (70 y.o. [name] Primary Care Provider: [name] Other Clinician: Referring Provider: Treating Provider/Extender: Georgiann Hahn in Treatment: 9 Diagnosis Coding ICD-10 Codes Code Description I87.2 Venous insufficiency (chronic) (peripheral) I89.0 Lymphedema, not elsewhere classified L97.812 Non-pressure chronic ulcer of other part of right lower leg with fat layer exposed I10 Essential (primary) hypertension Facility Procedures CPT4 Code Description Modifier Quantity [id number] (Facility Use Only) [phone] - APPLY MULTLAY COMPRS LWR RT LEG 1 Electronic Signature(s) Signed: 03/03/2021 4:37:06 PM By: Baltazar Najjar MD Signed: 03/03/2021 4:40:08 PM By: Zenaida Deed RN, BSN Entered By: Zenaida Deed on 03/03/2021 13:03:24

## 2021-03-10 ENCOUNTER — Other Ambulatory Visit: Payer: Self-pay

## 2021-03-10 ENCOUNTER — Encounter (HOSPITAL_BASED_OUTPATIENT_CLINIC_OR_DEPARTMENT_OTHER): Payer: Medicare Other | Admitting: Physician Assistant

## 2021-03-10 DIAGNOSIS — L97812 Non-pressure chronic ulcer of other part of right lower leg with fat layer exposed: Secondary | ICD-10-CM | POA: Diagnosis not present

## 2021-03-10 NOTE — Progress Notes (Signed)
Tammy Fitzpatrick, Tammy Fitzpatrick ([id number]) Visit Report for 03/10/2021 Arrival Information Details Patient Name: Date of Service: Tammy Fitzpatrick, Tammy Fitzpatrick 03/10/2021 1:30 PM Medical Record Number: [id number] Patient Account Number: [id number] Date of Birth/Sex: Treating RN: [birth year] (70 y.o. Debara Pickett, Millard.Loa Primary Care Iwao Shamblin: [name] Other Clinician: Referring Tonie Elsey: Treating Brenden Rudman/Extender: Erie Noe, Dinah Weeks in Treatment: 10 Visit Information History Since Last Visit Added or deleted any medications: No Patient Arrived: Ambulatory Any new allergies or adverse reactions: No Arrival Time: 13:56 Had a fall or experienced change in No Accompanied By: self activities of daily living that may affect Transfer Assistance: None risk of falls: Patient Identification Verified: Yes Signs or symptoms of abuse/neglect since last visito No Secondary Verification Process Completed: Yes Hospitalized since last visit: No Patient Requires Transmission-Based Precautions: No Implantable device outside of the clinic excluding No Patient Has Alerts: No cellular tissue based products placed in the center since last visit: Has Dressing in Place as Prescribed: Yes Has Compression in Place as Prescribed: Yes Pain Present Now: No Electronic Signature(s) Signed: 03/10/2021 5:51:35 PM By: Shawn Stall RN, BSN Entered By: Shawn Stall on 03/10/2021 13:56:47 -------------------------------------------------------------------------------- Clinic Level of Care Assessment Details Patient Name: Date of Service: Tammy Fitzpatrick, Tammy Fitzpatrick 03/10/2021 1:30 PM Medical Record Number: [id number] Patient Account Number: [id number] Date of Birth/Sex: Treating RN: 08/07/1950 (70 y.o. Debara Pickett, Millard.Loa Primary Care Susanne Baumgarner: [name] Other Clinician: Referring Olney Monier: Treating Aira Sallade/Extender: Lenda Kelp Ngetich, Dinah Weeks in Treatment: 10 Clinic Level of Care Assessment Items TOOL 4  Quantity Score X- 1 0 Use when only an EandM is performed on FOLLOW-UP visit ASSESSMENTS - Nursing Assessment / Reassessment X- 1 10 Reassessment of Co-morbidities (includes updates in patient status) X- 1 5 Reassessment of Adherence to Treatment Plan ASSESSMENTS - Wound and Skin A ssessment / Reassessment X - Simple Wound Assessment / Reassessment - one wound 1 5 []  - 0 Complex Wound Assessment / Reassessment - multiple wounds X- 1 10 Dermatologic / Skin Assessment (not related to wound area) ASSESSMENTS - Focused Assessment X- 1 5 Circumferential Edema Measurements - multi extremities []  - 0 Nutritional Assessment / Counseling / Intervention []  - 0 Lower Extremity Assessment (monofilament, tuning fork, pulses) []  - 0 Peripheral Arterial Disease Assessment (using hand held doppler) ASSESSMENTS - Ostomy and/or Continence Assessment and Care []  - 0 Incontinence Assessment and Management []  - 0 Ostomy Care Assessment and Management (repouching, etc.) PROCESS - Coordination of Care X - Simple Patient / Family Education for ongoing care 1 15 []  - 0 Complex (extensive) Patient / Family Education for ongoing care X- 1 10 Staff obtains , Records, T Results / Process Orders est []  - 0 Staff telephones HHA, Nursing Homes / Clarify orders / etc []  - 0 Routine Transfer to another Facility (non-emergent condition) []  - 0 Routine Hospital Admission (non-emergent condition) []  - 0 New Admissions / / Ordering NPWT Apligraf, etc. , []  - 0 Emergency Hospital Admission (emergent condition) X- 1 10 Simple Discharge Coordination []  - 0 Complex (extensive) Discharge Coordination PROCESS - Special Needs []  - 0 Pediatric / Minor Patient Management []  - 0 Isolation Patient Management []  - 0 Hearing / Language / Visual special needs []  - 0 Assessment of Community assistance (transportation, D/C planning, etc.) []  - 0 Additional assistance / Altered  mentation []  - 0 Support Surface(s) Assessment (bed, cushion, seat, etc.) INTERVENTIONS - Wound Cleansing / Measurement X - Simple Wound Cleansing - one wound 1 5 []  - 0  Complex Wound Cleansing - multiple wounds X- 1 5 Wound Imaging (photographs - any number of wounds) []  - 0 Wound Tracing (instead of photographs) X- 1 5 Simple Wound Measurement - one wound []  - 0 Complex Wound Measurement - multiple wounds INTERVENTIONS - Wound Dressings X - Small Wound Dressing one or multiple wounds 1 10 []  - 0 Medium Wound Dressing one or multiple wounds []  - 0 Large Wound Dressing one or multiple wounds []  - 0 Application of Medications - topical []  - 0 Application of Medications - injection INTERVENTIONS - Miscellaneous []  - 0 External ear exam []  - 0 Specimen Collection (cultures, biopsies, blood, body fluids, etc.) []  - 0 Specimen(s) / Culture(s) sent or taken to Lab for analysis []  - 0 Patient Transfer (multiple staff / / Similar devices) []  - 0 Simple Staple / Suture removal (25 or less) []  - 0 Complex Staple / Suture removal (26 or more) []  - 0 Hypo / Hyperglycemic Management (close monitor of Blood Glucose) []  - 0 Ankle / Brachial Index (ABI) - do not check if billed separately X- 1 5 Vital Signs Has the patient been seen at the hospital within the last three years: Yes Total Score: 100 Level Of Care: New/Established - Level 3 Electronic Signature(s) Signed: 03/10/2021 5:51:35 PM By: RN, BSN Entered By: on 03/10/2021 14:08:27 -------------------------------------------------------------------------------- Encounter Discharge Information Details Patient Name: Date of Service: Tammy Fitzpatrick, Tammy Fitzpatrick 03/10/2021 1:30 PM Medical Record Number: Patient Account Number: Date of Birth/Sex: Treating RN: [birth year] (70 y.o. Primary Care Brahm Barbeau: Other Clinician: Referring  Karey Suthers: Treating Lindsey Hommel/Extender: , Dinah Weeks in Treatment: 10 Encounter Discharge Information Items Discharge Condition: Stable Ambulatory Status: Ambulatory Discharge Destination: Home Transportation: Private Auto Accompanied By: self Schedule Follow-up Appointment: No Clinical Summary of Care: Electronic Signature(s) Signed: 03/10/2021 5:51:35 PM By: 03/12/2021 RN, BSN Entered By: Shawn Stall on 03/10/2021 14:09:02 -------------------------------------------------------------------------------- Lower Extremity Assessment Details Patient Name: Date of Service: Tammy Fitzpatrick, Tammy Fitzpatrick 03/10/2021 1:30 PM Medical Record Number: 03/12/2021 Patient Account Number: [id number] Date of Birth/Sex: Treating RN: October 31, 1950 (70 y.o. 66 Primary Care Ra Pfiester: [name] Other Clinician: Referring Miyani Cronic: Treating Chalene Treu/Extender: [name] Ngetich, Dinah Weeks in Treatment: 10 Edema Assessment Assessed: [Left: No] [Right: Yes] Edema: [Left: Ye] [Right: s] Calf Left: Right: Point of Measurement: 29 cm From Medial Instep 49.5 cm Ankle Left: Right: Point of Measurement: 9 cm From Medial Instep 29.5 cm Vascular Assessment Pulses: Dorsalis Pedis Palpable: [Right:Yes] Electronic Signature(s) Signed: 03/10/2021 5:51:35 PM By: 03/12/2021 RN, BSN Entered By: Shawn Stall on 03/10/2021 13:59:53 -------------------------------------------------------------------------------- Multi-Disciplinary Care Plan Details Patient Name: Date of Service: Tammy Fitzpatrick, Tammy Fitzpatrick 03/10/2021 1:30 PM Medical Record Number: 03/12/2021 Patient Account Number: [id number] Date of Birth/Sex: Treating RN: February 10, 1951 (70 y.o. 66 Primary Care Pricella Gaugh: [name] Other Clinician: Referring Deano Tomaszewski: Treating Thanos Cousineau/Extender: [name], Dinah Weeks in Treatment: 10 Active Inactive Electronic Signature(s) Signed:  03/10/2021 5:51:35 PM By: 03/12/2021 RN, BSN Entered By: Shawn Stall on 03/10/2021 14:07:47 -------------------------------------------------------------------------------- Pain Assessment Details Patient Name: Date of Service: Tammy Fitzpatrick, Tammy Fitzpatrick 03/10/2021 1:30 PM Medical Record Number: 03/12/2021 Patient Account Number: [id number] Date of Birth/Sex: Treating RN: 13-Jul-1950 (70 y.o. 66 Primary Care Kao Conry: [name] Other Clinician: Referring Eidan Muellner: Treating Fayette Gasner/Extender: [name], Dinah Weeks in Treatment: 10 Active Problems Location of Pain Severity and Description of Pain Patient Has Paino No Site  Locations Rate the pain. Current Pain Level: 0 Pain Management and Medication Current Pain Management: Medication: No Cold Application: No Rest: No Massage: No Activity: No T.E.N.S.: No Heat Application: No Leg drop or elevation: No Is the Current Pain Management Adequate: Adequate How does your wound impact your activities of daily livingo Sleep: No Bathing: No Appetite: No Relationship With Others: No Bladder Continence: No Emotions: No Bowel Continence: No Work: No Toileting: No Drive: No Dressing: No Hobbies: No Electronic Signature(s) Signed: 03/10/2021 5:51:35 PM By: Shawn Stall RN, BSN Entered By: Shawn Stall on 03/10/2021 13:59:42 -------------------------------------------------------------------------------- Patient/Caregiver Education Details Patient Name: Date of Service: Tammy Fitzpatrick 10/19/2022andnbsp1:30 PM Medical Record Number: [id number] Patient Account Number: [id number] Date of Birth/Gender: Treating RN: 02-Feb-1951 (70 y.o. [name] Primary Care Physician: [name] Other Clinician: Referring Physician: Treating Physician/Extender: Sherlene Shams in Treatment: 10 Education Assessment Education Provided To: Patient Education Topics  Provided Wound/Skin Impairment: Handouts: Skin Care Do's and Dont's Methods: Explain/Verbal Responses: Reinforcements needed Electronic Signature(s) Signed: 03/10/2021 5:51:35 PM By: Shawn Stall RN, BSN Entered By: Shawn Stall on 03/10/2021 14:07:58 -------------------------------------------------------------------------------- Wound Assessment Details Patient Name: Date of Service: Tammy Fitzpatrick, Tammy Fitzpatrick 03/10/2021 1:30 PM Medical Record Number: [id number] Patient Account Number: [id number] Date of Birth/Sex: Treating RN: Feb 13, 1951 (70 y.o. Debara Pickett, Millard.Loa Primary Care Ilyse Tremain: [name] Other Clinician: Referring Wilbur Oakland: Treating Kahliyah Dick/Extender: Lenda Kelp Ngetich, Dinah Weeks in Treatment: 10 Wound Status Wound Number: 1 Primary Etiology: Lymphedema Wound Location: Right, Medial Lower Leg Wound Status: Healed - Epithelialized Wounding Event: Trauma Comorbid History: Hypertension, Osteoarthritis Date Acquired: 09/21/2020 Weeks Of Treatment: 10 Clustered Wound: No Photos Wound Measurements Length: (cm) Width: (cm) Depth: (cm) Area: (cm) Volume: (cm) 0 % Reduction in Area: 100% 0 % Reduction in Volume: 100% 0 Epithelialization: Large (67-100%) 0 Tunneling: No 0 Undermining: No Wound Description Classification: Full Thickness Without Exposed Support Structures Wound Margin: Distinct, outline attached Exudate Amount: None Present Foul Odor After Cleansing: No Slough/Fibrino No Wound Bed Granulation Amount: None Present (0%) Exposed Structure Necrotic Amount: None Present (0%) Fascia Exposed: No Fat Layer (Subcutaneous Tissue) Exposed: Yes Tendon Exposed: No Muscle Exposed: No Joint Exposed: No Bone Exposed: No Treatment Notes Wound #1 (Lower Leg) Wound Laterality: Right, Medial Cleanser Peri-Wound Care Topical Primary Dressing Secondary Dressing Secured With Compression Wrap Compression Stockings Add-Ons Notes foam border for  proteciton. Electronic Signature(s) Signed: 03/10/2021 5:51:35 PM By: Shawn Stall RN, BSN Entered By: Shawn Stall on 03/10/2021 14:06:41 -------------------------------------------------------------------------------- Vitals Details Patient Name: Date of Service: Tammy Fitzpatrick, Tammy Fitzpatrick 03/10/2021 1:30 PM Medical Record Number: [id number] Patient Account Number: [id number] Date of Birth/Sex: Treating RN: January 27, 1951 (70 y.o. [name] Primary Care Willian Donson: [name] Other Clinician: Referring Azim Gillingham: Treating Aquarius Latouche/Extender: Lenda Kelp Ngetich, Dinah Weeks in Treatment: 10 Vital Signs Time Taken: 13:56 Temperature (F): 98.5 Height (in): 69 Pulse (bpm): 96 Weight (lbs): 250 Respiratory Rate (breaths/min): 20 Body Mass Index (BMI): 36.9 Blood Pressure (mmHg): 152/90 Reference Range: 80 - 120 mg / dl Electronic Signature(s) Signed: 03/10/2021 5:51:35 PM By: Shawn Stall RN, BSN Entered By: Shawn Stall on 03/10/2021 13:57:08

## 2021-03-10 NOTE — Progress Notes (Addendum)
Tammy Fitzpatrick, Tammy Fitzpatrick ([id number]) Visit Report for 03/10/2021 Chief Complaint Document Details Patient Name: Date of Service: Tammy Fitzpatrick, Tammy Fitzpatrick 03/10/2021 1:30 PM Medical Record Number: [id number] Patient Account Number: [id number] Date of Birth/Sex: Treating RN: 1950/10/12 (70 y.o. Tammy Fitzpatrick Primary Care Provider: [name] Other Clinician: Referring Provider: Treating Provider/Extender: Erie Noe, Dinah Weeks in Treatment: 10 Information Obtained from: Patient Chief Complaint Right leg ulcer following dog scratch June 2022 Electronic Signature(s) Signed: 03/10/2021 1:38:08 PM By: Lenda Kelp PA-C Entered By: Lenda Kelp on 03/10/2021 13:38:08 -------------------------------------------------------------------------------- HPI Details Patient Name: Date of Service: Tammy Fitzpatrick, Tammy Fitzpatrick 03/10/2021 1:30 PM Medical Record Number: [id number] Patient Account Number: [id number] Date of Birth/Sex: Treating RN: 1951-01-08 (70 y.o. Tammy Fitzpatrick Primary Care Provider: [name] Other Clinician: Referring Provider: Treating Provider/Extender: Erie Noe, Dinah Weeks in Treatment: 10 History of Present Illness HPI Description: 12/28/2020 upon evaluation today patient presents for initial evaluation here in our clinic concerning a wound that she has over the right medial lower leg. This is subsequently a result of initially a trauma from her dog where he jumped up on her and caused a laceration. With that being said unfortunately the patient has been having issues with this since that time trying to get this healed. Specifically the initial injury occurred to November 16, 2020. The sutures did seem to be his around 12/07/2020 she was placed on doxycycline. Following that time she has been using Xeroform and a border gauze dressing currently which again is keeping it covered but that is about it. With regard to medical history the patient does have  what appears to be an obvious history of chronic venous insufficiency and lymphedema. She also has high blood pressure noted today.. 01/06/2021 upon evaluation today patient appears to be doing well with regard to her wound. This actually appears to be significantly improved as compared to last week. She has been tolerating the dressing changes. In fact there really does not appear to be anything Require sharp debridement today as she is truly doing so well. She does not necessarily like the compression wrap which to be honest I completely understand but nonetheless I do believe its been beneficial as well as far as getting this wound to heal appropriately. 01/13/2021 upon evaluation today patient appears to be doing well with regard to her leg ulcer. She has been tolerating the dressing changes without complication. Hydrofera Blue was done excellent job up to this point. With that being said I do not believe that we are seeing any signs of worsening and in fact everything appears to be significantly improved compared to previous. 01/20/2021 upon evaluation today patient appears to be doing much better in regard to her wound. This is actually showing signs of significant improvement which is also news. I do not see any evidence of active infection which is also great and in general I think that we are headed in the appropriate direction. 01/27/2021 upon evaluation today patient appears to be doing well with regard to her wound. She has been tolerating the dressing changes without complication. Fortunately there does not appear to be any signs of active infection which is great news and overall I am extremely pleased with where we stand today. No fevers, chills, nausea, vomiting, or diarrhea. 02/03/2021 upon evaluation today patient appears to be doing well with regard to her wound. She has been tolerating the dressing changes without complication. Fortunately there does not appear to be any evidence of  active  infection which is great news and overall I am extremely pleased with where we stand today. No fevers, chills, nausea, vomiting, or diarrhea. 02/10/2021 upon evaluation today patient's wound is actually showing signs of good improvement. Everything is a healed around the edge and mainly what were dealing with currently is simply the main issue with the primary/deeper region where she did have again in the beginning a much deeper hole. We been using the Hydrofera Blue tucked into this area but I think its not really allowing it to healing as effectively as I would like to see. I think we may want to change to something a little different today. 02/17/2021 upon evaluation today patient appears to be doing well with regard to her wound. She has been tolerating the dressing changes without complication. Good news is there does not appear to be any signs of infection currently the bad news is this is still taking much longer than we were hoping to get this small tunnel to fill-in. 02/24/2021 upon evaluation today patient appears to be doing well with regard to her wound. Fortunately this is showing some signs of being a little bit tighter as far as the overall opening is concerned as compared to last week. This is great news. With that being said I do believe that we are headed in the right track. In fact I am not even certain we really need to pack anything down into the wound at this point I think just compression and putting something on the outside to catch any drainage is probably can be the optimal way to go. This was discussed with the patient today. 03/10/2021 upon evaluation today patient actually appears to be doing excellent in regard to her wounds. In fact the wound on her leg is completely healed. I am extremely pleased with where things stand at this point. Electronic Signature(s) Signed: 03/10/2021 2:11:46 PM By: Lenda Kelp PA-C Entered By: Lenda Kelp on 03/10/2021  14:11:45 -------------------------------------------------------------------------------- Physical Exam Details Patient Name: Date of Service: Tammy Fitzpatrick, Tammy Fitzpatrick 03/10/2021 1:30 PM Medical Record Number: [id number] Patient Account Number: [id number] Date of Birth/Sex: Treating RN: April 22, 1951 (70 y.o. Tammy Fitzpatrick Primary Care Provider: [name] Other Clinician: Referring Provider: Treating Provider/Extender: Lenda Kelp Ngetich, Dinah Weeks in Treatment: 10 Constitutional Well-nourished and well-hydrated in no acute distress. Respiratory normal breathing without difficulty. Psychiatric this patient is able to make decisions and demonstrates good insight into disease process. Alert and Oriented x 3. pleasant and cooperative. Notes Upon inspection patient's wound bed showed signs of good epithelization at this point. Fortunately there does not appear to be any signs of infection which is great news and overall I am extremely pleased with where we stand today. Electronic Signature(s) Signed: 03/10/2021 2:12:52 PM By: Lenda Kelp PA-C Entered By: Lenda Kelp on 03/10/2021 14:12:52 -------------------------------------------------------------------------------- Physician Orders Details Patient Name: Date of Service: NAKYIA, DAU 03/10/2021 1:30 PM Medical Record Number: [id number] Patient Account Number: [id number] Date of Birth/Sex: Treating RN: 1950-09-25 (70 y.o. Tammy Fitzpatrick Primary Care Provider: [name] Other Clinician: Referring Provider: Treating Provider/Extender: Erie Noe, Dinah Weeks in Treatment: 10 Verbal / Phone Orders: No Diagnosis Coding ICD-10 Coding Code Description I87.2 Venous insufficiency (chronic) (peripheral) I89.0 Lymphedema, not elsewhere classified L97.812 Non-pressure chronic ulcer of other part of right lower leg with fat layer exposed I10 Essential (primary) hypertension Discharge From [facility]  Services Discharge from Wound Care Center - Call if any future wound care needs. Keep area  covered for protection at least 1 week. May way compression to legs daily for any swelling. Electronic Signature(s) Signed: 03/10/2021 4:03:51 PM By: Lenda Kelp PA-C Signed: 03/10/2021 5:51:35 PM By: Shawn Stall RN, BSN Entered By: Shawn Stall on 03/10/2021 14:07:40 -------------------------------------------------------------------------------- Problem List Details Patient Name: Date of Service: Tammy Fitzpatrick, Tammy Fitzpatrick 03/10/2021 1:30 PM Medical Record Number: [id number] Patient Account Number: [id number] Date of Birth/Sex: Treating RN: August 01, 1950 (70 y.o. Tammy Fitzpatrick Primary Care Provider: [name] Other Clinician: Referring Provider: Treating Provider/Extender: Erie Noe, Dinah Weeks in Treatment: 10 Active Problems ICD-10 Encounter Code Description Active Date MDM Diagnosis I87.2 Venous insufficiency (chronic) (peripheral) 12/28/2020 No Yes I89.0 Lymphedema, not elsewhere classified 12/28/2020 No Yes L97.812 Non-pressure chronic ulcer of other part of right lower leg with fat layer 12/28/2020 No Yes exposed I10 Essential (primary) hypertension 12/28/2020 No Yes Inactive Problems Resolved Problems Electronic Signature(s) Signed: 03/10/2021 1:37:56 PM By: Lenda Kelp PA-C Entered By: Lenda Kelp on 03/10/2021 13:37:55 -------------------------------------------------------------------------------- Progress Note Details Patient Name: Date of Service: Tammy Fitzpatrick, Tammy Fitzpatrick 03/10/2021 1:30 PM Medical Record Number: [id number] Patient Account Number: [id number] Date of Birth/Sex: Treating RN: 02/21/1951 (70 y.o. Tammy Fitzpatrick Primary Care Provider: [name] Other Clinician: Referring Provider: Treating Provider/Extender: Erie Noe, Dinah Weeks in Treatment: 10 Subjective Chief Complaint Information obtained from Patient Right  leg ulcer following dog scratch June 2022 History of Present Illness (HPI) 12/28/2020 upon evaluation today patient presents for initial evaluation here in our clinic concerning a wound that she has over the right medial lower leg. This is subsequently a result of initially a trauma from her dog where he jumped up on her and caused a laceration. With that being said unfortunately the patient has been having issues with this since that time trying to get this healed. Specifically the initial injury occurred to November 16, 2020. The sutures did seem to be his around 12/07/2020 she was placed on doxycycline. Following that time she has been using Xeroform and a border gauze dressing currently which again is keeping it covered but that is about it. With regard to medical history the patient does have what appears to be an obvious history of chronic venous insufficiency and lymphedema. She also has high blood pressure noted today.. 01/06/2021 upon evaluation today patient appears to be doing well with regard to her wound. This actually appears to be significantly improved as compared to last week. She has been tolerating the dressing changes. In fact there really does not appear to be anything Require sharp debridement today as she is truly doing so well. She does not necessarily like the compression wrap which to be honest I completely understand but nonetheless I do believe its been beneficial as well as far as getting this wound to heal appropriately. 01/13/2021 upon evaluation today patient appears to be doing well with regard to her leg ulcer. She has been tolerating the dressing changes without complication. Hydrofera Blue was done excellent job up to this point. With that being said I do not believe that we are seeing any signs of worsening and in fact everything appears to be significantly improved compared to previous. 01/20/2021 upon evaluation today patient appears to be doing much better in regard to  her wound. This is actually showing signs of significant improvement which is also news. I do not see any evidence of active infection which is also great and in general I think that we are headed in the appropriate direction. 01/27/2021  upon evaluation today patient appears to be doing well with regard to her wound. She has been tolerating the dressing changes without complication. Fortunately there does not appear to be any signs of active infection which is great news and overall I am extremely pleased with where we stand today. No fevers, chills, nausea, vomiting, or diarrhea. 02/03/2021 upon evaluation today patient appears to be doing well with regard to her wound. She has been tolerating the dressing changes without complication. Fortunately there does not appear to be any evidence of active infection which is great news and overall I am extremely pleased with where we stand today. No fevers, chills, nausea, vomiting, or diarrhea. 02/10/2021 upon evaluation today patient's wound is actually showing signs of good improvement. Everything is a healed around the edge and mainly what were dealing with currently is simply the main issue with the primary/deeper region where she did have again in the beginning a much deeper hole. We been using the Hydrofera Blue tucked into this area but I think its not really allowing it to healing as effectively as I would like to see. I think we may want to change to something a little different today. 02/17/2021 upon evaluation today patient appears to be doing well with regard to her wound. She has been tolerating the dressing changes without complication. Good news is there does not appear to be any signs of infection currently the bad news is this is still taking much longer than we were hoping to get this small tunnel to fill-in. 02/24/2021 upon evaluation today patient appears to be doing well with regard to her wound. Fortunately this is showing some signs of being  a little bit tighter as far as the overall opening is concerned as compared to last week. This is great news. With that being said I do believe that we are headed in the right track. In fact I am not even certain we really need to pack anything down into the wound at this point I think just compression and putting something on the outside to catch any drainage is probably can be the optimal way to go. This was discussed with the patient today. 03/10/2021 upon evaluation today patient actually appears to be doing excellent in regard to her wounds. In fact the wound on her leg is completely healed. I am extremely pleased with where things stand at this point. Objective Constitutional Well-nourished and well-hydrated in no acute distress. Vitals Time Taken: 1:56 PM, Height: 69 in, Weight: 250 lbs, BMI: 36.9, Temperature: 98.5 F, Pulse: 96 bpm, Respiratory Rate: 20 breaths/min, Blood Pressure: 152/90 mmHg. Respiratory normal breathing without difficulty. Psychiatric this patient is able to make decisions and demonstrates good insight into disease process. Alert and Oriented x 3. pleasant and cooperative. General Notes: Upon inspection patient's wound bed showed signs of good epithelization at this point. Fortunately there does not appear to be any signs of infection which is great news and overall I am extremely pleased with where we stand today. Integumentary (Hair, Skin) Wound #1 status is Healed - Epithelialized. Original cause of wound was Trauma. The date acquired was: 09/21/2020. The wound has been in treatment 10 weeks. The wound is located on the Right,Medial Lower Leg. The wound measures 0cm length x 0cm width x 0cm depth; 0cm^2 area and 0cm^3 volume. There is Fat Layer (Subcutaneous Tissue) exposed. There is no tunneling or undermining noted. There is a none present amount of drainage noted. The wound margin is distinct with the outline  attached to the wound base. There is no granulation  within the wound bed. There is no necrotic tissue within the wound bed. Assessment Active Problems ICD-10 Venous insufficiency (chronic) (peripheral) Lymphedema, not elsewhere classified Non-pressure chronic ulcer of other part of right lower leg with fat layer exposed Essential (primary) hypertension Plan Discharge From [facility] Services: Discharge from Wound Care Center - Call if any future wound care needs. Keep area covered for protection at least 1 week. May way compression to legs daily for any swelling. 1. I would recommend that we go ahead and discontinue wound care services as the patient appears to be completely healed. 2. We will use a protective dressing over this just for the next 3 or so days after she removes that if there is no drainage at home think she has not put anything further over the area. We will see the patient back for follow-up visit as needed. Electronic Signature(s) Signed: 03/10/2021 2:13:30 PM By: Lenda Kelp PA-C Entered By: Lenda Kelp on 03/10/2021 14:13:30 -------------------------------------------------------------------------------- SuperBill Details Patient Name: Date of Service: Tammy Fitzpatrick, Tammy Fitzpatrick 03/10/2021 Medical Record Number: [id number] Patient Account Number: [id number] Date of Birth/Sex: Treating RN: June 18, 1950 (70 y.o. Tammy Fitzpatrick, Millard.Loa Primary Care Provider: [name] Other Clinician: Referring Provider: Treating Provider/Extender: Erie Noe, Dinah Weeks in Treatment: 10 Diagnosis Coding ICD-10 Codes Code Description I87.2 Venous insufficiency (chronic) (peripheral) I89.0 Lymphedema, not elsewhere classified L97.812 Non-pressure chronic ulcer of other part of right lower leg with fat layer exposed I10 Essential (primary) hypertension Facility Procedures Physician Procedures : CPT4 Code Description Modifier [id number] - WC PHYS LEVEL 3 - EST PT ICD-10 Diagnosis Description I87.2 Venous insufficiency  (chronic) (peripheral) I89.0 Lymphedema, not elsewhere classified L97.812 Non-pressure chronic ulcer of other part of right  lower leg with fat layer exposed I10 Essential (primary) hypertension Quantity: 1 Electronic Signature(s) Signed: 03/10/2021 2:13:48 PM By: Lenda Kelp PA-C Entered By: Lenda Kelp on 03/10/2021 14:13:48

## 2021-04-15 ENCOUNTER — Other Ambulatory Visit: Payer: Self-pay | Admitting: Family

## 2021-04-15 DIAGNOSIS — I10 Essential (primary) hypertension: Secondary | ICD-10-CM

## 2021-04-19 ENCOUNTER — Other Ambulatory Visit: Payer: Medicare Other

## 2021-04-21 ENCOUNTER — Other Ambulatory Visit: Payer: Medicare Other

## 2021-04-21 ENCOUNTER — Other Ambulatory Visit: Payer: Self-pay

## 2021-04-21 DIAGNOSIS — E7800 Pure hypercholesterolemia, unspecified: Secondary | ICD-10-CM

## 2021-04-22 LAB — LIPID PANEL
Cholesterol: 197 mg/dL (ref <200)
HDL: 44 mg/dL — ABNORMAL LOW (ref >=50)
LDL Cholesterol (Calc): 108 mg/dL (calc) — ABNORMAL HIGH
Non-HDL Cholesterol (Calc): 153 mg/dL (calc) — ABNORMAL HIGH (ref <130)
Total CHOL/HDL Ratio: 4.5 (calc) (ref <5.0)
Triglycerides: 341 mg/dL — ABNORMAL HIGH (ref <150)

## 2021-04-28 ENCOUNTER — Ambulatory Visit (INDEPENDENT_AMBULATORY_CARE_PROVIDER_SITE_OTHER): Payer: Medicare Other | Admitting: Family

## 2021-04-28 ENCOUNTER — Encounter: Payer: Self-pay | Admitting: Family

## 2021-04-28 ENCOUNTER — Other Ambulatory Visit: Payer: Self-pay

## 2021-04-28 VITALS — BP 158/89 | HR 89 | Temp 97.1°F | Resp 18 | Ht 69.0 in | Wt 263.8 lb

## 2021-04-28 DIAGNOSIS — M25562 Pain in left knee: Secondary | ICD-10-CM | POA: Diagnosis not present

## 2021-04-28 DIAGNOSIS — G8929 Other chronic pain: Secondary | ICD-10-CM

## 2021-04-28 DIAGNOSIS — E781 Pure hyperglyceridemia: Secondary | ICD-10-CM

## 2021-04-28 DIAGNOSIS — Z780 Asymptomatic menopausal state: Secondary | ICD-10-CM

## 2021-04-28 DIAGNOSIS — I10 Essential (primary) hypertension: Secondary | ICD-10-CM | POA: Diagnosis not present

## 2021-04-28 DIAGNOSIS — Z1231 Encounter for screening mammogram for malignant neoplasm of breast: Secondary | ICD-10-CM

## 2021-04-28 DIAGNOSIS — E785 Hyperlipidemia, unspecified: Secondary | ICD-10-CM

## 2021-04-28 MED ORDER — OMEGA-3-ACID ETHYL ESTERS 1 G PO CAPS
1.0000 g | ORAL_CAPSULE | Freq: Every day | ORAL | 0 refills | Status: AC
Start: 2021-04-28 — End: 2024-03-13

## 2021-04-28 NOTE — Patient Instructions (Signed)
Please check blood pressure at home and record  x 2 weeks send readings on Mychart to provider to evaluate

## 2021-04-28 NOTE — Progress Notes (Signed)
 Provider: Dinah Ngetich FNP-C   Ngetich, Dinah C, NP  Patient Care Team: Ngetich, Dinah C, NP as PCP - General (Family Medicine)  Extended Emergency Contact Information Primary Emergency Contact: Vivona,frankie Address: [address]          [place], [place] [address] [place] Home Phone: [phone] Work Phone: [phone] Mobile Phone: [phone] Relation: Daughter Secondary Emergency Contact: Rody,[name] Home Phone: [phone] Mobile Phone: [phone] Relation: Son  Code Status:  Full Code  Goals of care: Advanced Directive information Advanced Directives 04/28/2021  Does Patient Have a Medical Advance Directive? No  Would patient like information on creating a medical advance directive? No - Patient declined     Chief Complaint  Patient presents with   Medical Management of Chronic Issues    3 month follow up.   Health Maintenance    Discuss the need for Mammogram, Dexa scan, and Colonoscopy.   Immunizations    Discuss the need for Shingrix vaccine, and 2nd Covid Booster.    HPI:  Pt is a 70 y.o. female seen today for 3 months follow up for medical management of chronic diseases. Continue to complain of bilateral knee pain.she rarely takes ibuprofen.Pain mostly worst with activity and after prolong sitting.    B/p elevated on arrival.Her home blood pressure readings ranging in the 120's/70's - 130's / 80's with HR in the 70's.she denies any headache,dizziness,vision changes,fatigue,chest tightness,palpitation,chest pain or shortness of breath.        Had colonoscopy in 2020 was told to repeat in 5 yrs.denies any rectal bleeding tarry or bloody stool.  Also due for mammogram and Bone density.will order today.reports no symptoms. No fall episode.  Continues to attempt weight loss.Has had a 30 lbs weight loss since last visit.   Made aware she due for shingles and 2 nd COVID-19 vaccine.will get vaccine at the pharmacy.    Past Medical  History:  Diagnosis Date   Arthritis    History of colonoscopy 2020   Hypertension    History reviewed. No pertinent surgical history.  Allergies  Allergen Reactions   Other Hives and Rash    Poison Ivy    Allergies as of 04/28/2021       Reactions   Other Hives, Rash   Poison Ivy        Medication List        Accurate as of April 28, 2021 11:56 AM. If you have any questions, ask your nurse or doctor.          ADVIL COLD/SINUS PO Take 1 tablet by mouth daily as needed (headache).   losartan 25 MG tablet Commonly known as: COZAAR TAKE 1 TABLET(25 MG) BY MOUTH DAILY   multivitamin with minerals Tabs tablet Take 1 tablet by mouth daily.   OPTIFIBER LEAN PO Take by mouth.        Review of Systems  Constitutional:  Negative for appetite change, chills, fatigue, fever and unexpected weight change.  HENT:  Negative for congestion, dental problem, ear discharge, ear pain, facial swelling, hearing loss, nosebleeds, postnasal drip, rhinorrhea, sinus pressure, sinus pain, sneezing, sore throat, tinnitus and trouble swallowing.   Eyes:  Negative for pain, discharge, redness, itching and visual disturbance.  Respiratory:  Negative for cough, chest tightness, shortness of breath and wheezing.   Cardiovascular:  Negative for chest pain, palpitations and leg swelling.  Gastrointestinal:  Negative for abdominal distention, abdominal pain, blood in stool, constipation, diarrhea, nausea and vomiting.  Endocrine: Negative   for cold intolerance, heat intolerance, polydipsia, polyphagia and polyuria.  Genitourinary:  Negative for difficulty urinating, dysuria, flank pain, frequency and urgency.  Musculoskeletal:  Positive for arthralgias and gait problem. Negative for joint swelling, myalgias, neck pain and neck stiffness.  Skin:  Negative for color change, pallor, rash and wound.  Neurological:  Negative for dizziness, syncope, speech difficulty, weakness, light-headedness,  numbness and headaches.  Hematological:  Does not bruise/bleed easily.  Psychiatric/Behavioral:  Negative for agitation, behavioral problems, confusion, hallucinations, self-injury, sleep disturbance and suicidal ideas. The patient is not nervous/anxious.    Immunization History  Administered Date(s) Administered   Fluad Quad(high Dose 65+) 01/28/2021   PFIZER(Purple Top)SARS-COV-2 Vaccination 06/15/2019, 07/06/2019, 05/19/2020   Pneumococcal Polysaccharide-23 01/12/2021   Tdap 11/16/2020   Pertinent  Health Maintenance Due  Topic Date Due   COLONOSCOPY (Pts 45-77yr Insurance coverage will need to be confirmed)  Never done   MAMMOGRAM  Never done   DEXA SCAN  Never done   INFLUENZA VACCINE  Completed   Fall Risk 11/26/2020 12/07/2020 01/12/2021 01/28/2021 04/28/2021  Falls in the past year? - - 0 0 0  Was there an injury with Fall? - - 0 0 0  Fall Risk Category Calculator - - 0 0 0  Fall Risk Category - - Low Low Low  Patient Fall Risk Level _0   Patient at Risk for Falls Due to - - No Fall Risks No Fall Risks No Fall Risks  Fall risk Follow up - - Falls evaluation completed Falls evaluation completed Falls evaluation completed   Functional Status Survey:    Vitals:   04/28/21 1111 04/28/21 1156  BP: (!) 160/110 (!) 158/89  Pulse: 89   Resp: 18   Temp: (!) 97.1 F (36.2 C)   SpO2: 98%   Weight: 263 lb 12.8 oz (119.7 kg)   Height: 5' 9" (1.753 m)    Body mass index is 38.96 kg/m. Physical Exam Vitals reviewed.  Constitutional:      General: She is not in acute distress.    Appearance: Normal appearance. She is normal weight. She is not ill-appearing or diaphoretic.  HENT:     Head: Normocephalic.     Right Ear: Tympanic membrane, ear canal and external ear normal. There is no impacted cerumen.     Left Ear: Tympanic membrane, ear canal and external ear normal. There is no impacted cerumen.     Nose: Nose  normal. No congestion or rhinorrhea.     Mouth/Throat:     Mouth: Mucous membranes are moist.     Pharynx: Oropharynx is clear. No oropharyngeal exudate or posterior oropharyngeal erythema.  Eyes:     General: No scleral icterus.       Right eye: No discharge.        Left eye: No discharge.     Extraocular Movements: Extraocular movements intact.     Conjunctiva/sclera: Conjunctivae normal.     Pupils: Pupils are equal, round, and reactive to light.  Neck:     Vascular: No carotid bruit.  Cardiovascular:     Rate and Rhythm: Normal rate and regular rhythm.     Pulses: Normal pulses.     Heart sounds: Normal heart sounds. No murmur heard.   No friction rub. No gallop.  Pulmonary:     Effort: Pulmonary effort is normal. No respiratory distress.     Breath sounds: Normal breath sounds. No wheezing,  rhonchi or rales.  Chest:     Chest wall: No tenderness.  Abdominal:     General: Bowel sounds are normal. There is no distension.     Palpations: Abdomen is soft. There is no mass.     Tenderness: There is no abdominal tenderness. There is no right CVA tenderness, left CVA tenderness, guarding or rebound.  Musculoskeletal:        General: No swelling or tenderness. Normal range of motion.     Cervical back: Normal range of motion. No rigidity or tenderness.     Right knee: Crepitus present. No swelling, effusion or erythema. Normal range of motion. No tenderness.     Left knee: Crepitus present. No swelling, effusion or erythema. Normal range of motion. No tenderness.     Right lower leg: No edema.     Left lower leg: No edema.  Lymphadenopathy:     Cervical: No cervical adenopathy.  Skin:    General: Skin is warm and dry.     Coloration: Skin is not pale.     Findings: No bruising, erythema, lesion or rash.  Neurological:     Mental Status: She is alert and oriented to person, place, and time.     Cranial Nerves: No cranial nerve deficit.     Sensory: No sensory deficit.      Motor: No weakness.     Coordination: Coordination normal.     Gait: Gait normal.  Psychiatric:        Mood and Affect: Mood normal.        Speech: Speech normal.        Behavior: Behavior normal.        Thought Content: Thought content normal.        Judgment: Judgment normal.    Labs reviewed: Recent Labs    01/12/21 1504  NA 140  K 3.9  CL 105  CO2 25  GLUCOSE 114*  BUN 17  CREATININE 1.04*  CALCIUM 9.4   Recent Labs    01/12/21 1504  AST 12  ALT 10  BILITOT 0.5  PROT 7.3   Recent Labs    01/12/21 1504  WBC 5.0  NEUTROABS 2,730  HGB 13.7  HCT 42.3  MCV 88.9  PLT 261   Lab Results  Component Value Date   TSH 2.99 01/12/2021   Lab Results  Component Value Date   HGBA1C 5.6 01/12/2021   Lab Results  Component Value Date   CHOL 197 04/21/2021   HDL 44 (L) 04/21/2021   LDLCALC 108 (H) 04/21/2021   TRIG 341 (H) 04/21/2021   CHOLHDL 4.5 04/21/2021    Significant Diagnostic Results in last 30 days:  No results found.  Assessment/Plan  1. Essential hypertension B/p reading elevated on arrival home readings are within target goal. - continue on losartan  - continue to monitor B/p at home if Readings > 140/90 will increase losartan to 50 mg tablet daily - CBC with Differential/Platelet; Future - CMP with eGFR([facility]); Future - check blood pressure at home and record  x 2 weeks send readings on Mychart to provider to evaluate   2. Hyperlipidemia LDL goal <100 LDL not at gaol  - Dietary modification and exercise at least 3 times per week for 30 minutes advised. Does not want to start on Statin for now.  - Lipid panel; Future  3. Chronic pain of left knee Continue on current pain regimen   4. Hypertriglyceridemia TRG 341  Recently change her diet    Advised to take omeg -3 fatty acids  - Lipid panel; Future - omega-3 acid ethyl esters (LOVAZA) 1 g capsule; Take 1 capsule (1 g total) by mouth daily.  Dispense: 90 capsule; Refill: 0  5. Breast  cancer screening by mammogram Asymptomatic  - MM DIGITAL SCREENING BILATERAL; Future  6. Postmenopausal estrogen deficiency No recent fall episode.  - DG Bone Density; Future  Family/ staff Communication: Reviewed plan of care with patient verbalized understanding  Labs/tests ordered:  - MM DIGITAL SCREENING BILATERAL; Future - DG Bone Density; Future  Next Appointment : 4 months for medical management of chronic issues.Fasting Labs prior to visit.    Sandrea Hughs, NP

## 2021-05-13 NOTE — Telephone Encounter (Signed)
Message routed to Ngetich, [name], NP to review and reply back to patient.

## 2021-08-26 ENCOUNTER — Other Ambulatory Visit: Payer: Medicare Other

## 2021-08-26 DIAGNOSIS — E781 Pure hyperglyceridemia: Secondary | ICD-10-CM

## 2021-08-26 DIAGNOSIS — I10 Essential (primary) hypertension: Secondary | ICD-10-CM

## 2021-08-26 DIAGNOSIS — E785 Hyperlipidemia, unspecified: Secondary | ICD-10-CM

## 2021-08-27 LAB — CBC WITH DIFFERENTIAL/PLATELET
Absolute Monocytes: 349 cells/uL (ref 200–950)
Basophils Absolute: 38 cells/uL (ref 0–200)
Basophils Relative: 0.9 %
Eosinophils Absolute: 59 cells/uL (ref 15–500)
Eosinophils Relative: 1.4 %
HCT: 38.1 % (ref 35.0–45.0)
Hemoglobin: 12.8 g/dL (ref 11.7–15.5)
Lymphs Abs: 1646 cells/uL (ref 850–3900)
MCH: 30.3 pg (ref 27.0–33.0)
MCHC: 33.6 g/dL (ref 32.0–36.0)
MCV: 90.1 fL (ref 80.0–100.0)
MPV: 9.9 fL (ref 7.5–12.5)
Monocytes Relative: 8.3 %
Neutro Abs: 2108 cells/uL (ref 1500–7800)
Neutrophils Relative %: 50.2 %
Platelets: 250 10*3/uL (ref 140–400)
RBC: 4.23 10*6/uL (ref 3.80–5.10)
RDW: 13.5 % (ref 11.0–15.0)
Total Lymphocyte: 39.2 %
WBC: 4.2 10*3/uL (ref 3.8–10.8)

## 2021-08-27 LAB — COMPLETE METABOLIC PANEL WITH GFR
AG Ratio: 1.4 (calc) (ref 1.0–2.5)
ALT: 10 U/L (ref 6–29)
AST: 13 U/L (ref 10–35)
Albumin: 4.1 g/dL (ref 3.6–5.1)
Alkaline phosphatase (APISO): 36 U/L — ABNORMAL LOW (ref 37–153)
BUN/Creatinine Ratio: 20 (calc) (ref 6–22)
BUN: 21 mg/dL (ref 7–25)
CO2: 25 mmol/L (ref 20–32)
Calcium: 9.5 mg/dL (ref 8.6–10.4)
Chloride: 104 mmol/L (ref 98–110)
Creat: 1.07 mg/dL — ABNORMAL HIGH (ref 0.60–1.00)
Globulin: 2.9 g/dL (calc) (ref 1.9–3.7)
Glucose, Bld: 105 mg/dL — ABNORMAL HIGH (ref 65–99)
Potassium: 4.7 mmol/L (ref 3.5–5.3)
Sodium: 140 mmol/L (ref 135–146)
Total Bilirubin: 0.5 mg/dL (ref 0.2–1.2)
Total Protein: 7 g/dL (ref 6.1–8.1)
eGFR: 56 mL/min/{1.73_m2} — ABNORMAL LOW (ref >=60)

## 2021-08-27 LAB — LIPID PANEL
Cholesterol: 215 mg/dL — ABNORMAL HIGH (ref <200)
HDL: 85 mg/dL (ref >=50)
LDL Cholesterol (Calc): 114 mg/dL (calc) — ABNORMAL HIGH
Non-HDL Cholesterol (Calc): 130 mg/dL (calc) — ABNORMAL HIGH (ref <130)
Total CHOL/HDL Ratio: 2.5 (calc) (ref <5.0)
Triglycerides: 65 mg/dL (ref <150)

## 2021-08-30 ENCOUNTER — Ambulatory Visit (INDEPENDENT_AMBULATORY_CARE_PROVIDER_SITE_OTHER): Payer: Medicare Other | Admitting: Family

## 2021-08-30 ENCOUNTER — Encounter: Payer: Self-pay | Admitting: Family

## 2021-08-30 VITALS — BP 110/80 | HR 98 | Temp 97.3°F | Resp 16 | Ht 69.0 in | Wt 222.1 lb

## 2021-08-30 DIAGNOSIS — Z6832 Body mass index (BMI) 32.0-32.9, adult: Secondary | ICD-10-CM | POA: Diagnosis not present

## 2021-08-30 DIAGNOSIS — E785 Hyperlipidemia, unspecified: Secondary | ICD-10-CM | POA: Diagnosis not present

## 2021-08-30 DIAGNOSIS — I10 Essential (primary) hypertension: Secondary | ICD-10-CM | POA: Diagnosis not present

## 2021-08-30 DIAGNOSIS — E6609 Other obesity due to excess calories: Secondary | ICD-10-CM | POA: Diagnosis not present

## 2021-08-30 NOTE — Progress Notes (Signed)
? ?Provider: [name] FNP-C  ? ?Kashden Deboy, [name], NP ? ?Patient Care Team: ?Latausha Flamm, [name], NP as PCP - General (Family Medicine) ? ?Extended Emergency Contact Information ?Primary Emergency Contact: Ludwick,frankie ?Address: [address] ?         [place], [place] [address] [place] ?Home Phone: [phone] ?Work Phone: [phone] ?Mobile Phone: [phone] ?Relation: Daughter ?Secondary Emergency Contact: Paule,[name] ?Home Phone: [phone] ?Mobile Phone: [phone] ?Relation: Son ? ?Code Status:  Full Code  ?Goals of care: Advanced Directive information ? ?  08/30/2021  ? 10:59 AM  ?Advanced Directives  ?Does Patient Have a Medical Advance Directive? No  ?Would patient like information on creating a medical advance directive? No - Patient declined  ? ? ? ?Chief Complaint  ?Patient presents with  ? Medical Management of Chronic Issues  ?  4 Month follow up/Discuss recent labs.  ? Health Maintenance  ?  Discuss the need for Mammogram, Dexa scan, and Colonoscopy.   ? Immunizations  ?  Discuss the need for Shingrix vaccine, and Covid Booster.   ? ? ?HPI:  ?Pt is a 71 y.o. female seen today for 4 months follow-up for medical management of chronic diseases.  Has medical history of essential hypertension, hyperlipidemia, osteoarthritis among others.She denies any acute issues today. ?She brought in her blood pressure log readings ranging in the 110/70's - 130's/70's few readings noted and the 90's /70s.denies any headache,dizziness,vision changes,fatigue,chest tightness,palpitation,chest pain or shortness of breath.    ? ?She states exercises by walking 6 miles daily.  And has changed her diet. ?She has had about 42 pounds weight loss since last seen over 4 months ago.  Congratulated today for dietary modification and exercise. ? ?Health maintenance: ?Chart indicates due for colonoscopy but states had colonoscopy done by [facility] physicians.  Will obtain records and then update on  epic. ? ?Also due for screening mammography.  Reports no symptoms. ?She is also due for bone density will order today along with mammogram. ? ? ?Discussed also getting COVID booster vaccine and Shingrix vaccine at the pharmacy. ? ?Past Medical History:  ?Diagnosis Date  ? Arthritis   ? History of colonoscopy 2020  ? Hypertension   ? ?History reviewed. No pertinent surgical history. ? ?Allergies  ?Allergen Reactions  ? Other Hives and Rash  ?  Poison Ivy  ? ? ?Allergies as of 08/30/2021   ? ?   Reactions  ? Other Hives, Rash  ? Poison [place]  ? ?  ? ?  ?Medication List  ?  ? ?  ? Accurate as of August 30, 2021 11:59 PM. If you have any questions, ask your nurse or doctor.  ?  ?  ? ?  ? ?ADVIL COLD/SINUS PO ?Take 1 tablet by mouth daily as needed (headache). ?  ?losartan 25 MG tablet ?Commonly known as: COZAAR ?TAKE 1 TABLET(25 MG) BY MOUTH DAILY ?  ?multivitamin with minerals Tabs tablet ?Take 1 tablet by mouth daily. ?  ?omega-3 acid ethyl esters 1 g capsule ?Commonly known as: LOVAZA ?Take 1 capsule (1 g total) by mouth daily. ?  ?OPTIFIBER LEAN PO ?Take by mouth. ?  ? ?  ? ? ?Review of Systems  ?Constitutional:  Negative for appetite change, chills, fatigue, fever and unexpected weight change.  ?HENT:  Negative for congestion, dental problem, ear discharge, ear pain, facial swelling, hearing loss, nosebleeds, postnasal drip, rhinorrhea, sinus pressure, sinus pain, sneezing, sore throat, tinnitus and trouble swallowing.   ?Eyes:  Negative for pain, discharge, redness, itching and visual disturbance.  ?Respiratory:  Negative for cough, chest tightness, shortness of breath and wheezing.   ?Cardiovascular:  Negative for chest pain, palpitations and leg swelling.  ?Gastrointestinal:  Negative for abdominal distention, abdominal pain, blood in stool, constipation, diarrhea, nausea and vomiting.  ?Endocrine: Negative for cold intolerance, heat intolerance, polydipsia, polyphagia and polyuria.  ?Genitourinary:  Negative for  difficulty urinating, dysuria, flank pain, frequency and urgency.  ?Musculoskeletal:  Negative for arthralgias, back pain, gait problem, joint swelling, myalgias, neck pain and neck stiffness.  ?Skin:  Negative for color change, pallor, rash and wound.  ?Neurological:  Negative for dizziness, syncope, speech difficulty, weakness, light-headedness, numbness and headaches.  ?Hematological:  Does not bruise/bleed easily.  ?Psychiatric/Behavioral:  Negative for agitation, behavioral problems, confusion, hallucinations, self-injury, sleep disturbance and suicidal ideas. The patient is not nervous/anxious.   ? ?Immunization History  ?Administered Date(s) Administered  ? Fluad Quad(high Dose 65+) 01/28/2021  ? PFIZER(Purple Top)SARS-COV-2 Vaccination 06/15/2019, 07/06/2019, 05/19/2020  ? Pneumococcal Polysaccharide-23 01/12/2021  ? Tdap 11/16/2020  ? ?Pertinent  Health Maintenance Due  ?Topic Date Due  ? MAMMOGRAM  Never done  ? DEXA SCAN  Never done  ? INFLUENZA VACCINE  12/21/2021  ? COLONOSCOPY (Pts 45-4289yrs Insurance coverage will need to be confirmed)  10/18/2028  ? ? ?  12/07/2020  ? 10:13 AM 01/12/2021  ?  1:42 PM 01/28/2021  ? 10:32 AM 04/28/2021  ? 10:59 AM 08/30/2021  ? 10:59 AM  ?Fall Risk  ?Falls in the past year?  0 0 0 0  ?Was there an injury with Fall?  0 0 0 0  ?Fall Risk Category Calculator  0 0 0 0  ?Fall Risk Category  Low Low Low Low  ?Patient Fall Risk Level Low fall risk Low fall risk Low fall risk Low fall risk Low fall risk  ?Patient at Risk for Falls Due to  No Fall Risks No Fall Risks No Fall Risks No Fall Risks  ?Fall risk Follow up  Falls evaluation completed Falls evaluation completed Falls evaluation completed Falls evaluation completed  ? ?Functional Status Survey: ?  ? ?Vitals:  ? 08/30/21 1120  ?BP: 110/80  ?Pulse: 98  ?Resp: 16  ?Temp: (!) 97.3 ?F (36.3 ?C)  ?SpO2: 98%  ?Weight: 222 lb 2 oz (100.8 kg)  ?Height: 5\' 9"  (1.753 m)  ? ?Body mass index is 32.8 kg/m?Marland Kitchen. ?Physical Exam ?Vitals reviewed.   ?Constitutional:   ?   General: She is not in acute distress. ?   Appearance: Normal appearance. She is obese. She is not ill-appearing or diaphoretic.  ?HENT:  ?   Head: Normocephalic.  ?   Right Ear: Tympanic membrane, ear canal and external ear normal. There is no impacted cerumen.  ?   Left Ear: Tympanic membrane, ear canal and external ear normal. There is no impacted cerumen.  ?   Nose: Nose normal. No congestion or rhinorrhea.  ?   Mouth/Throat:  ?   Mouth: Mucous membranes are moist.  ?   Pharynx: Oropharynx is clear. No oropharyngeal exudate or posterior oropharyngeal erythema.  ?Eyes:  ?   General: No scleral icterus.    ?   Right eye: No discharge.     ?   Left eye: No discharge.  ?   Extraocular Movements: Extraocular movements intact.  ?   Conjunctiva/sclera: Conjunctivae normal.  ?   Pupils: Pupils are equal, round, and reactive to light.  ?Neck:  ?  Vascular: No carotid bruit.  ?Cardiovascular:  ?   Rate and Rhythm: Normal rate and regular rhythm.  ?   Pulses: Normal pulses.  ?   Heart sounds: Normal heart sounds. No murmur heard. ?  No friction rub. No gallop.  ?Pulmonary:  ?   Effort: Pulmonary effort is normal. No respiratory distress.  ?   Breath sounds: Normal breath sounds. No wheezing, rhonchi or rales.  ?Chest:  ?   Chest wall: No tenderness.  ?Abdominal:  ?   General: Bowel sounds are normal. There is no distension.  ?   Palpations: Abdomen is soft. There is no mass.  ?   Tenderness: There is no abdominal tenderness. There is no right CVA tenderness, left CVA tenderness, guarding or rebound.  ?Musculoskeletal:     ?   General: No swelling or tenderness. Normal range of motion.  ?   Cervical back: Normal range of motion. No rigidity or tenderness.  ?   Right lower leg: No edema.  ?   Left lower leg: No edema.  ?Lymphadenopathy:  ?   Cervical: No cervical adenopathy.  ?Skin: ?   General: Skin is warm and dry.  ?   Coloration: Skin is not pale.  ?   Findings: No bruising, erythema, lesion or  rash.  ?Neurological:  ?   Mental Status: She is alert and oriented to person, place, and time.  ?   Cranial Nerves: No cranial nerve deficit.  ?   Sensory: No sensory deficit.  ?   Motor: No weakness.  ?   Coordinat

## 2021-08-31 ENCOUNTER — Encounter: Payer: Self-pay | Admitting: *Deleted

## 2021-08-31 ENCOUNTER — Telehealth: Payer: Self-pay

## 2021-08-31 NOTE — Telephone Encounter (Signed)
Samara Deist from [facility] returning call for [facility] regarding Colonoscopy results. Samara Deist stated that results have been faxed over to [place].  ?

## 2021-09-06 ENCOUNTER — Encounter: Payer: Self-pay | Admitting: Family

## 2021-09-06 DIAGNOSIS — E6609 Other obesity due to excess calories: Secondary | ICD-10-CM | POA: Insufficient documentation

## 2021-09-06 DIAGNOSIS — Z6832 Body mass index (BMI) 32.0-32.9, adult: Secondary | ICD-10-CM | POA: Insufficient documentation

## 2021-10-29 ENCOUNTER — Other Ambulatory Visit: Payer: Self-pay | Admitting: Family

## 2021-10-29 DIAGNOSIS — I10 Essential (primary) hypertension: Secondary | ICD-10-CM

## 2022-02-24 ENCOUNTER — Other Ambulatory Visit: Payer: Medicare Other

## 2022-02-24 DIAGNOSIS — E785 Hyperlipidemia, unspecified: Secondary | ICD-10-CM

## 2022-02-24 DIAGNOSIS — I10 Essential (primary) hypertension: Secondary | ICD-10-CM

## 2022-02-26 LAB — CBC WITH DIFFERENTIAL/PLATELET
Absolute Monocytes: 285 cells/uL (ref 200–950)
Basophils Absolute: 30 cells/uL (ref 0–200)
Basophils Relative: 0.8 %
Eosinophils Absolute: 59 cells/uL (ref 15–500)
Eosinophils Relative: 1.6 %
HCT: 40 % (ref 35.0–45.0)
Hemoglobin: 13.5 g/dL (ref 11.7–15.5)
Lymphs Abs: 1621 cells/uL (ref 850–3900)
MCH: 30.6 pg (ref 27.0–33.0)
MCHC: 33.8 g/dL (ref 32.0–36.0)
MCV: 90.7 fL (ref 80.0–100.0)
MPV: 9.6 fL (ref 7.5–12.5)
Monocytes Relative: 7.7 %
Neutro Abs: 1706 cells/uL (ref 1500–7800)
Neutrophils Relative %: 46.1 %
Platelets: 221 10*3/uL (ref 140–400)
RBC: 4.41 10*6/uL (ref 3.80–5.10)
RDW: 13.4 % (ref 11.0–15.0)
Total Lymphocyte: 43.8 %
WBC: 3.7 10*3/uL — ABNORMAL LOW (ref 3.8–10.8)

## 2022-02-26 LAB — COMPLETE METABOLIC PANEL WITH GFR
AG Ratio: 1.5 (calc) (ref 1.0–2.5)
ALT: 11 U/L (ref 6–29)
AST: 16 U/L (ref 10–35)
Albumin: 4.2 g/dL (ref 3.6–5.1)
Alkaline phosphatase (APISO): 38 U/L (ref 37–153)
BUN/Creatinine Ratio: 22 (calc) (ref 6–22)
BUN: 23 mg/dL (ref 7–25)
CO2: 22 mmol/L (ref 20–32)
Calcium: 9.3 mg/dL (ref 8.6–10.4)
Chloride: 103 mmol/L (ref 98–110)
Creat: 1.06 mg/dL — ABNORMAL HIGH (ref 0.60–1.00)
Globulin: 2.8 g/dL (calc) (ref 1.9–3.7)
Glucose, Bld: 105 mg/dL — ABNORMAL HIGH (ref 65–99)
Potassium: 4.1 mmol/L (ref 3.5–5.3)
Sodium: 138 mmol/L (ref 135–146)
Total Bilirubin: 0.5 mg/dL (ref 0.2–1.2)
Total Protein: 7 g/dL (ref 6.1–8.1)
eGFR: 56 mL/min/{1.73_m2} — ABNORMAL LOW (ref >=60)

## 2022-02-26 LAB — LIPID PANEL
Cholesterol: 219 mg/dL — ABNORMAL HIGH (ref <200)
HDL: 93 mg/dL (ref >=50)
LDL Cholesterol (Calc): 112 mg/dL (calc) — ABNORMAL HIGH
Non-HDL Cholesterol (Calc): 126 mg/dL (calc) (ref <130)
Total CHOL/HDL Ratio: 2.4 (calc) (ref <5.0)
Triglycerides: 57 mg/dL (ref <150)

## 2022-02-26 LAB — HEMOGLOBIN A1C
Hgb A1c MFr Bld: 5.7 % of total Hgb — ABNORMAL HIGH (ref <5.7)
Mean Plasma Glucose: 117 mg/dL
eAG (mmol/L): 6.5 mmol/L

## 2022-02-26 LAB — TEST AUTHORIZATION

## 2022-02-26 LAB — TSH: TSH: 3.33 mIU/L (ref 0.40–4.50)

## 2022-03-01 ENCOUNTER — Encounter: Payer: Self-pay | Admitting: Family

## 2022-03-01 ENCOUNTER — Ambulatory Visit (INDEPENDENT_AMBULATORY_CARE_PROVIDER_SITE_OTHER): Payer: Medicare Other | Admitting: Family

## 2022-03-01 VITALS — BP 140/90 | HR 89 | Temp 96.9°F | Resp 16 | Ht 69.0 in | Wt 208.6 lb

## 2022-03-01 DIAGNOSIS — R7303 Prediabetes: Secondary | ICD-10-CM

## 2022-03-01 DIAGNOSIS — Z23 Encounter for immunization: Secondary | ICD-10-CM

## 2022-03-01 DIAGNOSIS — E785 Hyperlipidemia, unspecified: Secondary | ICD-10-CM | POA: Diagnosis not present

## 2022-03-01 DIAGNOSIS — I10 Essential (primary) hypertension: Secondary | ICD-10-CM

## 2022-03-01 NOTE — Progress Notes (Signed)
Provider: [name] FNP-C   Daleisa Halperin, [name], NP  Patient Care Team: Kalai Baca, [name], NP as PCP - General (Family Medicine)  Extended Emergency Contact Information Primary Emergency Contact: Sternberg,frankie Address: [address] [place], [place] [address] [place] Home Phone: [phone] Work Phone: [phone] Mobile Phone: [phone] Relation: Daughter Secondary Emergency Contact: Johns,[name] Home Phone: [phone] Mobile Phone: [phone] Relation: Son  Code Status:  Full Code  Goals of care: Advanced Directive information    03/01/2022    8:57 AM  Advanced Directives  Does Patient Have a Medical Advance Directive? No  Would patient like information on creating a medical advance directive? No - Patient declined     Chief Complaint  Patient presents with  . Medical Management of Chronic Issues    6 month follow up.  Marland Kitchen Health Maintenance    Discuss the need for Dexa scan, and Mammogram.  . Immunizations    Discuss the need for Shingrix vaccine, Covid Booster, Influenza vaccine, and Pne vaccine.    HPI:  Pt is a 71 y.o. female seen today for 6 months follow up for medical management of chronic diseases.    Total cholesterol 219 previous 215; LDL 112 prev 114 TRG 57   Glucose 105 ; A1C 5.7   CR 1.06 ; GFR 56  Due for Dexa and Mammogram due was ordred         Past Medical History:  Diagnosis Date  . Arthritis   . History of colonoscopy 2020  . Hypertension    History reviewed. No pertinent surgical history.  Allergies  Allergen Reactions  . Other Hives and Rash    Poison Ivy    Allergies as of 03/01/2022       Reactions   Other Hives, Rash   Poison Ivy        Medication List        Accurate as of March 01, 2022  9:02 AM. If you have any questions, ask your nurse or doctor.          ADVIL COLD/SINUS PO Take 1 tablet by mouth daily as needed (headache).   losartan 25 MG tablet Commonly  known as: COZAAR TAKE 1 TABLET(25 MG) BY MOUTH DAILY   multivitamin with minerals Tabs tablet Take 1 tablet by mouth daily.   omega-3 acid ethyl esters 1 g capsule Commonly known as: LOVAZA Take 1 capsule (1 g total) by mouth daily.   OPTIFIBER LEAN PO Take by mouth.        Review of Systems  Constitutional:  Negative for appetite change, chills, fatigue, fever and unexpected weight change.  HENT:  Negative for congestion, dental problem, ear discharge, ear pain, facial swelling, hearing loss, nosebleeds, postnasal drip, rhinorrhea, sinus pressure, sinus pain, sneezing, sore throat, tinnitus and trouble swallowing.   Eyes:  Negative for pain, discharge, redness, itching and visual disturbance.  Respiratory:  Negative for cough, chest tightness, shortness of breath and wheezing.   Cardiovascular:  Negative for chest pain, palpitations and leg swelling.  Gastrointestinal:  Negative for abdominal distention, abdominal pain, blood in stool, constipation, diarrhea, nausea and vomiting.  Endocrine: Negative for cold intolerance, heat intolerance, polydipsia, polyphagia and polyuria.  Genitourinary:  Negative for difficulty urinating, dysuria, flank pain, frequency and urgency.  Musculoskeletal:  Negative for arthralgias, back pain, gait problem, joint swelling, myalgias, neck pain and neck stiffness.  Skin:  Negative for color change, pallor, rash  and wound.  Neurological:  Negative for dizziness, syncope, speech difficulty, weakness, light-headedness, numbness and headaches.  Hematological:  Does not bruise/bleed easily.  Psychiatric/Behavioral:  Negative for agitation, behavioral problems, confusion, hallucinations, self-injury, sleep disturbance and suicidal ideas. The patient is not nervous/anxious.     Immunization History  Administered Date(s) Administered  . Fluad Quad(high Dose 65+) 01/28/2021  . PFIZER(Purple Top)SARS-COV-2 Vaccination 06/15/2019, 07/06/2019, 05/19/2020  .  Pneumococcal Polysaccharide-23 01/12/2021  . Tdap 11/16/2020   Pertinent  Health Maintenance Due  Topic Date Due  . MAMMOGRAM  Never done  . DEXA SCAN  Never done  . INFLUENZA VACCINE  12/21/2021  . COLONOSCOPY (Pts 45-31yrs Insurance coverage will need to be confirmed)  10/18/2028      01/12/2021    1:42 PM 01/28/2021   10:32 AM 04/28/2021   10:59 AM 08/30/2021   10:59 AM 03/01/2022    8:57 AM  Fall Risk  Falls in the past year? 0 0 0 0 0  Was there an injury with Fall? 0 0 0 0 0  Fall Risk Category Calculator 0 0 0 0 0  Fall Risk Category Low Low Low Low Low  Patient Fall Risk Level Low fall risk Low fall risk Low fall risk Low fall risk Low fall risk  Patient at Risk for Falls Due to No Fall Risks No Fall Risks No Fall Risks No Fall Risks No Fall Risks  Fall risk Follow up Falls evaluation completed Falls evaluation completed Falls evaluation completed Falls evaluation completed Falls evaluation completed   Functional Status Survey:    Vitals:   03/01/22 0856  BP: (!) 140/90  Pulse: 89  Resp: 16  Temp: (!) 96.9 F (36.1 C)  SpO2: 98%  Weight: 208 lb 9.6 oz (94.6 kg)  Height: 5\' 9"  (1.753 m)   Body mass index is 30.8 kg/m. Physical Exam Vitals reviewed.  Constitutional:      General: She is not in acute distress.    Appearance: Normal appearance. She is obese. She is not ill-appearing or diaphoretic.  HENT:     Head: Normocephalic.     Right Ear: Tympanic membrane, ear canal and external ear normal. There is no impacted cerumen.     Left Ear: Tympanic membrane, ear canal and external ear normal. There is no impacted cerumen.     Nose: Nose normal. No congestion or rhinorrhea.     Mouth/Throat:     Mouth: Mucous membranes are moist.     Pharynx: Oropharynx is clear. No oropharyngeal exudate or posterior oropharyngeal erythema.  Eyes:     General: No scleral icterus.       Right eye: No discharge.        Left eye: No discharge.     Extraocular Movements:  Extraocular movements intact.     Conjunctiva/sclera: Conjunctivae normal.     Pupils: Pupils are equal, round, and reactive to light.  Neck:     Vascular: No carotid bruit.  Cardiovascular:     Rate and Rhythm: Normal rate and regular rhythm.     Pulses: Normal pulses.     Heart sounds: Normal heart sounds. No murmur heard.    No friction rub. No gallop.     Comments: Varicose veins  Pulmonary:     Effort: Pulmonary effort is normal. No respiratory distress.     Breath sounds: Normal breath sounds. No wheezing, rhonchi or rales.  Chest:     Chest wall: No tenderness.  Abdominal:  General: Bowel sounds are normal. There is no distension.     Palpations: Abdomen is soft. There is no mass.     Tenderness: There is no abdominal tenderness. There is no right CVA tenderness, left CVA tenderness, guarding or rebound.  Musculoskeletal:        General: No swelling or tenderness. Normal range of motion.     Cervical back: Normal range of motion. No rigidity or tenderness.     Right lower leg: No edema.     Left lower leg: No edema.  Lymphadenopathy:     Cervical: No cervical adenopathy.  Skin:    General: Skin is warm and dry.     Coloration: Skin is not pale.     Findings: No bruising, erythema, lesion or rash.  Neurological:     Mental Status: She is alert and oriented to person, place, and time.     Cranial Nerves: No cranial nerve deficit.     Sensory: No sensory deficit.     Motor: No weakness.     Coordination: Coordination normal.     Gait: Gait normal.  Psychiatric:        Mood and Affect: Mood normal.        Speech: Speech normal.        Behavior: Behavior normal.        Thought Content: Thought content normal.        Judgment: Judgment normal.    Labs reviewed: Recent Labs    08/26/21 0927 02/24/22 0812  NA 140 138  K 4.7 4.1  CL 104 103  CO2 25 22  GLUCOSE 105* 105*  BUN 21 23  CREATININE 1.07* 1.06*  CALCIUM 9.5 9.3   Recent Labs    08/26/21 0927  02/24/22 0812  AST 13 16  ALT 10 11  BILITOT 0.5 0.5  PROT 7.0 7.0   Recent Labs    08/26/21 0927 02/24/22 0812  WBC 4.2 3.7*  NEUTROABS 2,108 1,706  HGB 12.8 13.5  HCT 38.1 40.0  MCV 90.1 90.7  PLT 250 221   Lab Results  Component Value Date   TSH 3.33 02/24/2022   Lab Results  Component Value Date   HGBA1C 5.7 (H) 02/24/2022   Lab Results  Component Value Date   CHOL 219 (H) 02/24/2022   HDL 93 02/24/2022   LDLCALC 112 (H) 02/24/2022   TRIG 57 02/24/2022   CHOLHDL 2.4 02/24/2022    Significant Diagnostic Results in last 30 days:  No results found.  Assessment/Plan 1. Need for influenza vaccination *** - Flu Vaccine QUAD High Dose(Fluad)    Family/ staff Communication: Reviewed plan of care with patient  Labs/tests ordered: None   Next Appointment : Return in about 6 months (around 08/31/2022) for medical mangement of chronic issues., fasting labs prior to visit.   Sandrea Hughs, NP

## 2022-07-24 ENCOUNTER — Other Ambulatory Visit: Payer: Self-pay | Admitting: Family

## 2022-07-24 DIAGNOSIS — I10 Essential (primary) hypertension: Secondary | ICD-10-CM

## 2022-08-24 ENCOUNTER — Other Ambulatory Visit: Payer: Medicare Other

## 2022-08-24 DIAGNOSIS — R7303 Prediabetes: Secondary | ICD-10-CM

## 2022-08-24 DIAGNOSIS — I10 Essential (primary) hypertension: Secondary | ICD-10-CM

## 2022-08-24 DIAGNOSIS — E785 Hyperlipidemia, unspecified: Secondary | ICD-10-CM

## 2022-08-25 LAB — CBC WITH DIFFERENTIAL/PLATELET
Absolute Monocytes: 312 cells/uL (ref 200–950)
Basophils Absolute: 21 cells/uL (ref 0–200)
Basophils Relative: 0.7 %
Eosinophils Absolute: 111 cells/uL (ref 15–500)
Eosinophils Relative: 3.7 %
HCT: 35.9 % (ref 35.0–45.0)
Hemoglobin: 11.9 g/dL (ref 11.7–15.5)
Lymphs Abs: 1539 cells/uL (ref 850–3900)
MCH: 29.8 pg (ref 27.0–33.0)
MCHC: 33.1 g/dL (ref 32.0–36.0)
MCV: 89.8 fL (ref 80.0–100.0)
MPV: 9.4 fL (ref 7.5–12.5)
Monocytes Relative: 10.4 %
Neutro Abs: 1017 cells/uL — ABNORMAL LOW (ref 1500–7800)
Neutrophils Relative %: 33.9 %
Platelets: 227 10*3/uL (ref 140–400)
RBC: 4 10*6/uL (ref 3.80–5.10)
RDW: 13.4 % (ref 11.0–15.0)
Total Lymphocyte: 51.3 %
WBC: 3 10*3/uL — ABNORMAL LOW (ref 3.8–10.8)

## 2022-08-25 LAB — COMPLETE METABOLIC PANEL WITH GFR
AG Ratio: 1.5 (calc) (ref 1.0–2.5)
ALT: 15 U/L (ref 6–29)
AST: 14 U/L (ref 10–35)
Albumin: 4 g/dL (ref 3.6–5.1)
Alkaline phosphatase (APISO): 43 U/L (ref 37–153)
BUN: 18 mg/dL (ref 7–25)
CO2: 23 mmol/L (ref 20–32)
Calcium: 8.9 mg/dL (ref 8.6–10.4)
Chloride: 109 mmol/L (ref 98–110)
Creat: 0.85 mg/dL (ref 0.60–1.00)
Globulin: 2.6 g/dL (calc) (ref 1.9–3.7)
Glucose, Bld: 106 mg/dL — ABNORMAL HIGH (ref 65–99)
Potassium: 4.3 mmol/L (ref 3.5–5.3)
Sodium: 140 mmol/L (ref 135–146)
Total Bilirubin: 0.4 mg/dL (ref 0.2–1.2)
Total Protein: 6.6 g/dL (ref 6.1–8.1)
eGFR: 73 mL/min/{1.73_m2} (ref >=60)

## 2022-08-25 LAB — HEMOGLOBIN A1C
Hgb A1c MFr Bld: 5.9 % of total Hgb — ABNORMAL HIGH (ref <5.7)
Mean Plasma Glucose: 123 mg/dL
eAG (mmol/L): 6.8 mmol/L

## 2022-08-25 LAB — LIPID PANEL
Cholesterol: 196 mg/dL (ref <200)
HDL: 97 mg/dL (ref >=50)
LDL Cholesterol (Calc): 88 mg/dL (calc)
Non-HDL Cholesterol (Calc): 99 mg/dL (calc) (ref <130)
Total CHOL/HDL Ratio: 2 (calc) (ref <5.0)
Triglycerides: 38 mg/dL (ref <150)

## 2022-08-25 LAB — TSH: TSH: 2.82 mIU/L (ref 0.40–4.50)

## 2022-08-29 ENCOUNTER — Encounter: Payer: Self-pay | Admitting: Family

## 2022-08-29 ENCOUNTER — Ambulatory Visit (INDEPENDENT_AMBULATORY_CARE_PROVIDER_SITE_OTHER): Payer: Medicare Other | Admitting: Family

## 2022-08-29 VITALS — BP 158/92 | HR 89 | Temp 97.2°F | Resp 20 | Ht 69.0 in | Wt 204.6 lb

## 2022-08-29 DIAGNOSIS — Z780 Asymptomatic menopausal state: Secondary | ICD-10-CM

## 2022-08-29 DIAGNOSIS — Z23 Encounter for immunization: Secondary | ICD-10-CM | POA: Diagnosis not present

## 2022-08-29 DIAGNOSIS — E785 Hyperlipidemia, unspecified: Secondary | ICD-10-CM | POA: Diagnosis not present

## 2022-08-29 DIAGNOSIS — I10 Essential (primary) hypertension: Secondary | ICD-10-CM

## 2022-08-29 DIAGNOSIS — R7303 Prediabetes: Secondary | ICD-10-CM

## 2022-08-29 DIAGNOSIS — Z1231 Encounter for screening mammogram for malignant neoplasm of breast: Secondary | ICD-10-CM

## 2022-08-29 MED ORDER — LOSARTAN POTASSIUM 50 MG PO TABS
50.0000 mg | ORAL_TABLET | Freq: Every day | ORAL | 1 refills | Status: DC
Start: 2022-08-29 — End: 2023-03-14

## 2022-08-29 NOTE — Progress Notes (Signed)
Provider: Richarda Bladeinah Ellissa Ayo FNP-C   Orah Sonnen, Donalee Citrininah C, NP  Patient Care Team: Mailynn Everly, Donalee Citrininah C, NP as PCP - General (Family Medicine)  Extended Emergency Contact Information Primary Emergency Contact: Arechiga,frankie Address: [address]          [place], [place] [id number] [place] Home Phone: [phone] Work Phone: [phone] Mobile Phone: [phone] Relation: Daughter Secondary Emergency Contact: Poole,[name] Home Phone: [phone] Mobile Phone: [phone] Relation: Son  Code Status:  Full Code  Goals of care: Advanced Directive information    03/01/2022    8:57 AM  Advanced Directives  Does Patient Have a Medical Advance Directive? No  Would patient like information on creating a medical advance directive? No - Patient declined     Chief Complaint  Patient presents with   Medical Management of Chronic Issues    Patient presents today for a 6 month follow-up   Quality Metric Gaps    DEXA scan, AWV, mammogram, zoster, pneumonia, COVID#4    HPI:  Pt is a 72 y.o. female seen today for 6 months follow up for  medical management of chronic diseases.   Recent labs reviewed and discussed during visit.Lab results normal except Glucose 106,Hgb A1C 5.9 previous was 5.7  WBC 3.0 and Neut 1,017   States B/p has been up and down runs 100's /50's -140's/80's  States fell one month ago after a dog tangling her with a leash.Had soreness on hip and arm but has improved.she did not go for evaluation in urgent care or ED. Due for bone density and Mammogram   Agrees to get PNA Aware to get shingles and COVID-19 at the pharmacy.     Past Medical History:  Diagnosis Date   Arthritis    History of colonoscopy 2020   Hypertension    History reviewed. No pertinent surgical history.  Allergies  Allergen Reactions   Other Hives and Rash    Poison Ivy    Allergies as of 08/29/2022       Reactions   Other Hives, Rash   Poison Ivy        Medication  List        Accurate as of August 29, 2022 10:14 AM. If you have any questions, ask your nurse or doctor.          losartan 25 MG tablet Commonly known as: COZAAR TAKE 1 TABLET(25 MG) BY MOUTH DAILY   multivitamin with minerals Tabs tablet Take 1 tablet by mouth daily.   omega-3 acid ethyl esters 1 g capsule Commonly known as: LOVAZA Take 1 capsule (1 g total) by mouth daily.   OPTIFIBER LEAN PO Take by mouth.        Review of Systems  Constitutional:  Negative for appetite change, chills, fatigue, fever and unexpected weight change.  HENT:  Negative for congestion, dental problem, ear discharge, ear pain, facial swelling, hearing loss, nosebleeds, postnasal drip, rhinorrhea, sinus pressure, sinus pain, sneezing, sore throat, tinnitus and trouble swallowing.   Eyes:  Negative for pain, discharge, redness, itching and visual disturbance.  Respiratory:  Negative for cough, chest tightness, shortness of breath and wheezing.   Cardiovascular:  Negative for chest pain, palpitations and leg swelling.  Gastrointestinal:  Negative for abdominal distention, abdominal pain, blood in stool, constipation, diarrhea, nausea and vomiting.  Endocrine: Negative for cold intolerance, heat intolerance, polydipsia, polyphagia and polyuria.  Genitourinary:  Negative for difficulty urinating, dysuria, flank pain, frequency and urgency.  Musculoskeletal:  Positive for arthralgias.  Negative for back pain, gait problem, joint swelling, myalgias, neck pain and neck stiffness.       Left arm soreness  post fall one month ago   Skin:  Negative for color change, pallor, rash and wound.  Neurological:  Negative for dizziness, syncope, speech difficulty, weakness, light-headedness, numbness and headaches.  Hematological:  Does not bruise/bleed easily.  Psychiatric/Behavioral:  Negative for agitation, behavioral problems, confusion, hallucinations, self-injury, sleep disturbance and suicidal ideas. The  patient is not nervous/anxious.     Immunization History  Administered Date(s) Administered   Fluad Quad(high Dose 65+) 01/28/2021, 03/01/2022   PFIZER(Purple Top)SARS-COV-2 Vaccination 06/15/2019, 07/06/2019, 05/19/2020   Pneumococcal Polysaccharide-23 01/12/2021   Tdap 11/16/2020   Pertinent  Health Maintenance Due  Topic Date Due   MAMMOGRAM  Never done   DEXA SCAN  Never done   INFLUENZA VACCINE  12/22/2022   COLONOSCOPY (Pts 45-68yrs Insurance coverage will need to be confirmed)  10/18/2028      01/28/2021   10:32 AM 04/28/2021   10:59 AM 08/30/2021   10:59 AM 03/01/2022    8:57 AM 08/29/2022    9:50 AM  Fall Risk  Falls in the past year? 0 0 0 0 0  Was there an injury with Fall? 0 0 0 0 0  Fall Risk Category Calculator 0 0 0 0 0  Fall Risk Category (Retired) Low Low Low Low   (RETIRED) Patient Fall Risk Level Low fall risk Low fall risk Low fall risk Low fall risk   Patient at Risk for Falls Due to No Fall Risks No Fall Risks No Fall Risks No Fall Risks No Fall Risks  Fall risk Follow up Falls evaluation completed Falls evaluation completed Falls evaluation completed Falls evaluation completed Falls evaluation completed   Functional Status Survey:    Vitals:   08/29/22 0931  BP: (!) 158/92  Pulse: 89  Resp: 20  Temp: (!) 97.2 F (36.2 C)  SpO2: 99%  Weight: 204 lb 9.6 oz (92.8 kg)  Height: 5\' 9"  (1.753 m)   Body mass index is 30.21 kg/m. Physical Exam Vitals reviewed.  Constitutional:      General: She is not in acute distress.    Appearance: Normal appearance. She is obese. She is not ill-appearing or diaphoretic.  HENT:     Head: Normocephalic.     Right Ear: Tympanic membrane, ear canal and external ear normal. There is no impacted cerumen.     Left Ear: Tympanic membrane, ear canal and external ear normal. There is no impacted cerumen.     Nose: Nose normal. No congestion or rhinorrhea.     Mouth/Throat:     Mouth: Mucous membranes are moist.      Pharynx: Oropharynx is clear. No oropharyngeal exudate or posterior oropharyngeal erythema.  Eyes:     General: No scleral icterus.       Right eye: No discharge.        Left eye: No discharge.     Extraocular Movements: Extraocular movements intact.     Conjunctiva/sclera: Conjunctivae normal.     Pupils: Pupils are equal, round, and reactive to light.  Neck:     Vascular: No carotid bruit.  Cardiovascular:     Rate and Rhythm: Normal rate and regular rhythm.     Pulses: Normal pulses.     Heart sounds: Normal heart sounds. No murmur heard.    No friction rub. No gallop.  Pulmonary:     Effort: Pulmonary effort is normal.  No respiratory distress.     Breath sounds: Normal breath sounds. No wheezing, rhonchi or rales.  Chest:     Chest wall: No tenderness.  Abdominal:     General: Bowel sounds are normal. There is no distension.     Palpations: Abdomen is soft. There is no mass.     Tenderness: There is no abdominal tenderness. There is no right CVA tenderness, left CVA tenderness, guarding or rebound.  Musculoskeletal:        General: No swelling or tenderness.     Right shoulder: Normal.     Left shoulder: No swelling, effusion or tenderness. Decreased range of motion. Normal strength.     Cervical back: Normal range of motion. No rigidity or tenderness.     Right knee: No swelling, effusion, erythema, ecchymosis or crepitus. Decreased range of motion. No tenderness.     Left knee: No swelling, effusion, erythema, ecchymosis or crepitus. Decreased range of motion. No tenderness.     Right lower leg: No edema.     Left lower leg: No edema.     Comments: Left lateral arm sore post fall one month ago   Lymphadenopathy:     Cervical: No cervical adenopathy.  Skin:    General: Skin is warm and dry.     Coloration: Skin is not pale.     Findings: No bruising, erythema, lesion or rash.  Neurological:     Mental Status: She is alert and oriented to person, place, and time.      Cranial Nerves: No cranial nerve deficit.     Sensory: No sensory deficit.     Motor: No weakness.     Coordination: Coordination normal.     Gait: Gait normal.  Psychiatric:        Mood and Affect: Mood normal.        Speech: Speech normal.        Behavior: Behavior normal.        Thought Content: Thought content normal.        Judgment: Judgment normal.    Labs reviewed: Recent Labs    02/24/22 0812 08/24/22 0805  NA 138 140  K 4.1 4.3  CL 103 109  CO2 22 23  GLUCOSE 105* 106*  BUN 23 18  CREATININE 1.06* 0.85  CALCIUM 9.3 8.9   Recent Labs    02/24/22 0812 08/24/22 0805  AST 16 14  ALT 11 15  BILITOT 0.5 0.4  PROT 7.0 6.6   Recent Labs    02/24/22 0812 08/24/22 0805  WBC 3.7* 3.0*  NEUTROABS 1,706 1,017*  HGB 13.5 11.9  HCT 40.0 35.9  MCV 90.7 89.8  PLT 221 227   Lab Results  Component Value Date   TSH 2.82 08/24/2022   Lab Results  Component Value Date   HGBA1C 5.9 (H) 08/24/2022   Lab Results  Component Value Date   CHOL 196 08/24/2022   HDL 97 08/24/2022   LDLCALC 88 08/24/2022   TRIG 38 08/24/2022   CHOLHDL 2.0 08/24/2022    Significant Diagnostic Results in last 30 days:  No results found.  Assessment/Plan  1. Uncontrolled hypertension Blood pressure not at goal Increase losartan as below  - losartan (COZAAR) 50 MG tablet; Take 1 tablet (50 mg total) by mouth daily.  Dispense: 90 tablet; Refill: 1 - TSH; Future - COMPLETE METABOLIC PANEL WITH GFR; Future - CBC with Differential/Platelet; Future  2. Hyperlipidemia LDL goal <100 Continue on Omega - 3 fatty  acid  - dietary and exercise modification  - Lipid panel; Future  3. Prediabetes Lab Results  Component Value Date   HGBA1C 5.9 (H) 08/24/2022  Higher than previous level  - dietary modification and exercise advised  - Hemoglobin A1c; Future  4. Postmenopausal estrogen deficiency Sustained a fall 2 months ago after the dog wrapped leash around her no fracture. - DG  BONE DENSITY (DXA)  5. Breast cancer screening by mammogram Asymptomatic  - MM 3D SCREENING MAMMOGRAM BILATERAL BREAST  6. Need for pneumococcal 20-valent conjugate vaccination Prevnar 20 vaccine administered today by CMA no reaction reported.  - Pneumococcal conjugate vaccine 20-valent (Prevnar 20).  Family/ staff Communication: Reviewed plan of care with patient verbalized understanding   Labs/tests ordered:  - DG BONE DENSITY (DXA) - MM 3D SCREENING MAMMOGRAM BILATERAL BREAST - Hemoglobin A1c; Future - Lipid panel; Future - TSH; Future - COMPLETE METABOLIC PANEL WITH GFR; Future - CBC with Differential/Platelet; Future  Next Appointment : Return in about 2 weeks (around 09/12/2022) for blood pressure .   Caesar Bookmaninah C Lilliann Rossetti, NP

## 2022-08-29 NOTE — Patient Instructions (Signed)
Patient needs to schedule AWV

## 2022-09-02 DIAGNOSIS — R7303 Prediabetes: Secondary | ICD-10-CM | POA: Insufficient documentation

## 2022-09-12 ENCOUNTER — Ambulatory Visit (INDEPENDENT_AMBULATORY_CARE_PROVIDER_SITE_OTHER): Payer: Medicare Other | Admitting: Family

## 2022-09-12 ENCOUNTER — Encounter: Payer: Self-pay | Admitting: Family

## 2022-09-12 VITALS — BP 144/80 | HR 80 | Temp 97.3°F | Ht 69.0 in | Wt 203.6 lb

## 2022-09-12 DIAGNOSIS — I10 Essential (primary) hypertension: Secondary | ICD-10-CM

## 2022-09-12 NOTE — Patient Instructions (Signed)
Schedule AWV.  

## 2022-09-12 NOTE — Progress Notes (Signed)
Provider: [name] FNP-C  Shaheem Pichon, [name], NP  Patient Care Team: Niam Nepomuceno, [name], NP as PCP - General (Family Medicine)  Extended Emergency Contact Information Primary Emergency Contact: Cuccaro,frankie Address: [address] [place], [place] [address] [place] of [place] Home Phone: [phone] Work Phone: [phone] Mobile Phone: [phone] Relation: Daughter Secondary Emergency Contact: Gaulin,[name] Home Phone: [phone] Mobile Phone: [phone] Relation: Son  Code Status:  Full Code  Goals of care: Advanced Directive information    03/01/2022    8:57 AM  Advanced Directives  Does Patient Have a Medical Advance Directive? No  Would patient like information on creating a medical advance directive? No - Patient declined     Chief Complaint  Patient presents with   Medical Management of Chronic Issues    Patient presents today for a 2 weeks follow-up.   Quality Metric Gaps    AWV, mammogram, DEXA scan, zoster, COVID#4    HPI:  Pt is a 72 y.o. female seen today for 2 weeks follow up for high blood pressure 152/92.Her losartan was increased from 25 mg to 50 mg tablet daily and advised to check Blood pressure at home record and notify provider if B/p > 140/90.Brought in B/p log on her phone readings ranging in the 100's/50's - 130's/60's some systolic readings noted in the 80's -90's in the afternoons .she denies any symptoms of hypotension. B/p slightly high today compared to home readings but thinks due to being in the office.   Has upcoming appointment for mammogram and Dexa scan scheduled for 03/16/2023. Also due for shingles vaccine aware to get vaccine at the pharmacy.   Past Medical History:  Diagnosis Date   Arthritis    History of colonoscopy 2020   Hypertension    History reviewed. No pertinent surgical history.  Allergies  Allergen Reactions   Other Hives and Rash    Poison Ivy    Outpatient Encounter Medications as  of 09/12/2022  Medication Sig   losartan (COZAAR) 50 MG tablet Take 1 tablet (50 mg total) by mouth daily.   Multiple Vitamin (MULTIVITAMIN WITH MINERALS) TABS tablet Take 1 tablet by mouth daily.   OPTIFIBER LEAN PO Take by mouth.   omega-3 acid ethyl esters (LOVAZA) 1 g capsule Take 1 capsule (1 g total) by mouth daily.   No facility-administered encounter medications on file as of 09/12/2022.    Review of Systems  Constitutional:  Negative for appetite change, chills, fatigue, fever and unexpected weight change.  Eyes:  Negative for pain, discharge, redness, itching and visual disturbance.  Respiratory:  Negative for cough, chest tightness, shortness of breath and wheezing.   Cardiovascular:  Negative for chest pain, palpitations and leg swelling.  Gastrointestinal:  Negative for abdominal distention, abdominal pain, diarrhea, nausea and vomiting.  Musculoskeletal:  Negative for arthralgias, back pain, gait problem, joint swelling and myalgias.  Neurological:  Negative for dizziness, syncope, speech difficulty, weakness, light-headedness, numbness and headaches.    Immunization History  Administered Date(s) Administered   Fluad Quad(high Dose 65+) 01/28/2021, 03/01/2022   PFIZER(Purple Top)SARS-COV-2 Vaccination 06/15/2019, 07/06/2019, 05/19/2020   PNEUMOCOCCAL CONJUGATE-20 08/29/2022   Pneumococcal Polysaccharide-23 01/12/2021   Tdap 11/16/2020   Pertinent  Health Maintenance Due  Topic Date Due   MAMMOGRAM  Never done   DEXA SCAN  Never done   INFLUENZA VACCINE  12/22/2022   COLONOSCOPY (Pts 45-41yrs Insurance coverage will need to be confirmed)  10/18/2028  01/28/2021   10:32 AM 04/28/2021   10:59 AM 08/30/2021   10:59 AM 03/01/2022    8:57 AM 08/29/2022    9:50 AM  Fall Risk  Falls in the past year? 0 0 0 0 0  Was there an injury with Fall? 0 0 0 0 0  Fall Risk Category Calculator 0 0 0 0 0  Fall Risk Category (Retired) Low Low Low Low   (RETIRED) Patient Fall Risk  Level Low fall risk Low fall risk Low fall risk Low fall risk   Patient at Risk for Falls Due to No Fall Risks No Fall Risks No Fall Risks No Fall Risks No Fall Risks  Fall risk Follow up Falls evaluation completed Falls evaluation completed Falls evaluation completed Falls evaluation completed Falls evaluation completed   Functional Status Survey:    Vitals:   09/12/22 0912 09/12/22 0920  BP: (!) 146/88 (!) 144/80  Pulse: 80   Temp: (!) 97.3 F (36.3 C)   SpO2: 99%   Weight: 203 lb 9.6 oz (92.4 kg)   Height:  (1.753 m)    Body mass index is 30.07 kg/m. Physical Exam Vitals reviewed.  Constitutional:      General: She is not in acute distress.    Appearance: Normal appearance. She is obese. She is not ill-appearing or diaphoretic.  HENT:     Head: Normocephalic.     Mouth/Throat:     Mouth: Mucous membranes are moist.     Pharynx: Oropharynx is clear.  Neck:     Vascular: No carotid bruit.  Cardiovascular:     Rate and Rhythm: Normal rate and regular rhythm.     Pulses: Normal pulses.     Heart sounds: Normal heart sounds. No murmur heard.    No friction rub. No gallop.  Pulmonary:     Effort: Pulmonary effort is normal. No respiratory distress.     Breath sounds: Normal breath sounds. No wheezing, rhonchi or rales.  Chest:     Chest wall: No tenderness.  Abdominal:     General: Bowel sounds are normal. There is no distension.     Palpations: Abdomen is soft. There is no mass.     Tenderness: There is no abdominal tenderness. There is no right CVA tenderness, left CVA tenderness, guarding or rebound.  Musculoskeletal:        General: No swelling or tenderness. Normal range of motion.     Cervical back: Normal range of motion. No rigidity or tenderness.     Right lower leg: No edema.     Left lower leg: No edema.  Lymphadenopathy:     Cervical: No cervical adenopathy.  Skin:    General: Skin is warm and dry.     Coloration: Skin is not pale.     Findings: No  erythema.  Neurological:     Mental Status: She is alert and oriented to person, place, and time.     Motor: No weakness.     Gait: Gait normal.  Psychiatric:        Mood and Affect: Mood normal.        Speech: Speech normal.        Behavior: Behavior normal.     Labs reviewed: Recent Labs    02/24/22 0812 08/24/22 0805  NA 138 140  K 4.1 4.3  CL 103 109  CO2 22 23  GLUCOSE 105* 106*  BUN 23 18  CREATININE 1.06* 0.85  CALCIUM 9.3 8.9  Recent Labs    02/24/22 0812 08/24/22 0805  AST 16 14  ALT 11 15  BILITOT 0.5 0.4  PROT 7.0 6.6   Recent Labs    02/24/22 0812 08/24/22 0805  WBC 3.7* 3.0*  NEUTROABS 1,706 1,017*  HGB 13.5 11.9  HCT 40.0 35.9  MCV 90.7 89.8  PLT 221 227   Lab Results  Component Value Date   TSH 2.82 08/24/2022   Lab Results  Component Value Date   HGBA1C 5.9 (H) 08/24/2022   Lab Results  Component Value Date   CHOL 196 08/24/2022   HDL 97 08/24/2022   LDLCALC 88 08/24/2022   TRIG 38 08/24/2022   CHOLHDL 2.0 08/24/2022    Significant Diagnostic Results in last 30 days:  No results found.  Assessment/Plan  Essential hypertension B/p here not at goal but home readings are within normal range with some systolic readings low soft B/p.will not adjust losartan based on low readings at home. She will continue to monitor B/p at home then notify provider for consistent readings > 140/90  - continue losartan 50 mg tablet daily. - Discussed taking baby Asprin but declined for now.   Family/ staff Communication: Reviewed plan of care with patient verbalized understanding.  Labs/tests ordered: None   Next Appointment: Return in about 6 months (around 03/14/2023) for medical mangement of chronic issues., Fasting labs in 6 months prior to visit.   Caesar Bookman, NP

## 2022-11-29 ENCOUNTER — Other Ambulatory Visit: Payer: Self-pay | Admitting: Family

## 2022-11-29 DIAGNOSIS — I10 Essential (primary) hypertension: Secondary | ICD-10-CM

## 2023-03-10 ENCOUNTER — Other Ambulatory Visit: Payer: Medicare Other

## 2023-03-10 DIAGNOSIS — I10 Essential (primary) hypertension: Secondary | ICD-10-CM

## 2023-03-10 DIAGNOSIS — R7303 Prediabetes: Secondary | ICD-10-CM

## 2023-03-10 DIAGNOSIS — E785 Hyperlipidemia, unspecified: Secondary | ICD-10-CM

## 2023-03-11 LAB — CBC WITH DIFFERENTIAL/PLATELET
Absolute Lymphocytes: 1613 {cells}/uL (ref 850–3900)
Absolute Monocytes: 353 {cells}/uL (ref 200–950)
Basophils Absolute: 29 {cells}/uL (ref 0–200)
Basophils Relative: 0.8 %
Eosinophils Absolute: 61 {cells}/uL (ref 15–500)
Eosinophils Relative: 1.7 %
HCT: 39.2 % (ref 35.0–45.0)
Hemoglobin: 12.7 g/dL (ref 11.7–15.5)
MCH: 29.3 pg (ref 27.0–33.0)
MCHC: 32.4 g/dL (ref 32.0–36.0)
MCV: 90.3 fL (ref 80.0–100.0)
MPV: 9.3 fL (ref 7.5–12.5)
Monocytes Relative: 9.8 %
Neutro Abs: 1544 {cells}/uL (ref 1500–7800)
Neutrophils Relative %: 42.9 %
Platelets: 269 10*3/uL (ref 140–400)
RBC: 4.34 10*6/uL (ref 3.80–5.10)
RDW: 13.7 % (ref 11.0–15.0)
Total Lymphocyte: 44.8 %
WBC: 3.6 10*3/uL — ABNORMAL LOW (ref 3.8–10.8)

## 2023-03-11 LAB — COMPLETE METABOLIC PANEL WITH GFR
AG Ratio: 1.5 (calc) (ref 1.0–2.5)
ALT: 15 U/L (ref 6–29)
AST: 14 U/L (ref 10–35)
Albumin: 4.3 g/dL (ref 3.6–5.1)
Alkaline phosphatase (APISO): 53 U/L (ref 37–153)
BUN: 23 mg/dL (ref 7–25)
CO2: 27 mmol/L (ref 20–32)
Calcium: 9.4 mg/dL (ref 8.6–10.4)
Chloride: 104 mmol/L (ref 98–110)
Creat: 0.89 mg/dL (ref 0.60–1.00)
Globulin: 2.8 g/dL (ref 1.9–3.7)
Glucose, Bld: 109 mg/dL — ABNORMAL HIGH (ref 65–99)
Potassium: 4.2 mmol/L (ref 3.5–5.3)
Sodium: 139 mmol/L (ref 135–146)
Total Bilirubin: 0.4 mg/dL (ref 0.2–1.2)
Total Protein: 7.1 g/dL (ref 6.1–8.1)
eGFR: 69 mL/min/{1.73_m2} (ref >=60)

## 2023-03-11 LAB — TSH: TSH: 3.22 m[IU]/L (ref 0.40–4.50)

## 2023-03-11 LAB — LIPID PANEL
Cholesterol: 195 mg/dL (ref <200)
HDL: 103 mg/dL (ref >=50)
LDL Cholesterol (Calc): 80 mg/dL
Non-HDL Cholesterol (Calc): 92 mg/dL (ref <130)
Total CHOL/HDL Ratio: 1.9 (calc) (ref <5.0)
Triglycerides: 50 mg/dL (ref <150)

## 2023-03-11 LAB — HEMOGLOBIN A1C
Hgb A1c MFr Bld: 5.9 %{Hb} — ABNORMAL HIGH (ref <5.7)
Mean Plasma Glucose: 123 mg/dL
eAG (mmol/L): 6.8 mmol/L

## 2023-03-14 ENCOUNTER — Encounter: Payer: Self-pay | Admitting: Family

## 2023-03-14 ENCOUNTER — Ambulatory Visit (INDEPENDENT_AMBULATORY_CARE_PROVIDER_SITE_OTHER): Payer: Medicare Other | Admitting: Family

## 2023-03-14 VITALS — BP 138/78 | HR 85 | Temp 96.3°F | Resp 20 | Ht 69.0 in | Wt 212.8 lb

## 2023-03-14 DIAGNOSIS — R7303 Prediabetes: Secondary | ICD-10-CM

## 2023-03-14 DIAGNOSIS — Z23 Encounter for immunization: Secondary | ICD-10-CM | POA: Diagnosis not present

## 2023-03-14 DIAGNOSIS — I10 Essential (primary) hypertension: Secondary | ICD-10-CM

## 2023-03-14 DIAGNOSIS — E785 Hyperlipidemia, unspecified: Secondary | ICD-10-CM | POA: Diagnosis not present

## 2023-03-14 MED ORDER — LOSARTAN POTASSIUM 50 MG PO TABS
50.0000 mg | ORAL_TABLET | Freq: Every day | ORAL | 1 refills | Status: DC
Start: 2023-03-14 — End: 2023-06-19

## 2023-03-14 NOTE — Patient Instructions (Signed)
Schedule AWV.  

## 2023-03-14 NOTE — Progress Notes (Signed)
Provider: [name] FNP-C   Tammy Fitzpatrick, [name], NP  Patient Care Team: Dorion Petillo, [name], NP as PCP - General (Family Medicine)  Extended Emergency Contact Information Primary Emergency Contact: [name],frankie Address: [address] [place], [place] [address] [place] of [place] Home Phone: [phone] Work Phone: [phone] Mobile Phone: [phone] Relation: Daughter Secondary Emergency Contact: [name],[name] Home Phone: [phone] Mobile Phone: [phone] Relation: Son  Code Status:  Full Code  Goals of care: Advanced Directive information    03/01/2022    8:57 AM  Advanced Directives  Does Patient Have a Medical Advance Directive? No  Would patient like information on creating a medical advance directive? No - Patient declined     Chief Complaint  Patient presents with   Medical Management of Chronic Issues    Patient presents today for a 6 month follow-up and lab review   Quality Metric Gaps    AWV,COVID #4, Mammogram, DEXA scan    HPI:  Pt is a 72 y.o. female seen today for 6 months follow up for medical management of chronic diseases.    Recent lab results reviewed and discussed during the visit.thyroid level ,kidney function,liver enzymes and electrolytes were all within normal range.   Hyperlipidemia - Total cholesterol,TRG and LDL all remained stable.  Prediabetes - Glucose 109,A 1 C stable 5.9 previous 5.9   White blood cells slightly low but improved from 3.0 to 3.6   Hypertension - B/p runs in the 110's/70 -120's/80.Has had some dizziness due to recent change in eye glasses.still trying to adjust.denies any headache,vision changes,fatigue,chest tightness,palpitation,chest pain or shortness of breath.      Has upcoming appointment for Mammogram and Bone density on Thursday 03/16/2023.   Declines COVID-19   Will receive her influenza vaccine this visit.  Past Medical History:  Diagnosis Date   Arthritis    History of  colonoscopy 2020   Hypertension    History reviewed. No pertinent surgical history.  Allergies  Allergen Reactions   Other Hives and Rash    Poison Ivy    Allergies as of 03/14/2023       Reactions   Other Hives, Rash   Poison Ivy        Medication List        Accurate as of March 14, 2023  9:35 AM. If you have any questions, ask your nurse or doctor.          losartan 50 MG tablet Commonly known as: COZAAR Take 1 tablet (50 mg total) by mouth daily.   multivitamin with minerals Tabs tablet Take 1 tablet by mouth daily.   omega-3 acid ethyl esters 1 g capsule Commonly known as: LOVAZA Take 1 capsule (1 g total) by mouth daily.   OPTIFIBER LEAN PO Take by mouth.        Review of Systems  Constitutional:  Negative for appetite change, chills, fatigue, fever and unexpected weight change.  HENT:  Negative for congestion, dental problem, ear discharge, ear pain, facial swelling, hearing loss, nosebleeds, postnasal drip, rhinorrhea, sinus pressure, sinus pain, sneezing, sore throat, tinnitus and trouble swallowing.   Eyes:  Negative for pain, discharge, redness, itching and visual disturbance.       Getting used to new eye glasses.   Respiratory:  Negative for cough, chest tightness, shortness of breath and wheezing.   Cardiovascular:  Negative for chest pain, palpitations and leg swelling.  Gastrointestinal:  Negative for abdominal distention,  abdominal pain, blood in stool, constipation, diarrhea, nausea and vomiting.  Endocrine: Negative for cold intolerance, heat intolerance, polydipsia, polyphagia and polyuria.  Genitourinary:  Negative for difficulty urinating, dysuria, flank pain, frequency and urgency.  Musculoskeletal:  Negative for arthralgias, back pain, gait problem, joint swelling, myalgias, neck pain and neck stiffness.  Skin:  Negative for color change, pallor, rash and wound.  Neurological:  Negative for dizziness, syncope, speech difficulty,  weakness, light-headedness, numbness and headaches.  Hematological:  Does not bruise/bleed easily.  Psychiatric/Behavioral:  Negative for agitation, behavioral problems, confusion, hallucinations, self-injury, sleep disturbance and suicidal ideas. The patient is not nervous/anxious.     Immunization History  Administered Date(s) Administered   Fluad Quad(high Dose 65+) 01/28/2021, 03/01/2022   PFIZER(Purple Top)SARS-COV-2 Vaccination 06/15/2019, 07/06/2019, 05/19/2020   PNEUMOCOCCAL CONJUGATE-20 08/29/2022   Pneumococcal Polysaccharide-23 01/12/2021   Tdap 11/16/2020   Pertinent  Health Maintenance Due  Topic Date Due   MAMMOGRAM  Never done   DEXA SCAN  Never done   INFLUENZA VACCINE  12/22/2022   Colonoscopy  10/18/2028      04/28/2021   10:59 AM 08/30/2021   10:59 AM 03/01/2022    8:57 AM 08/29/2022    9:50 AM 03/14/2023    8:58 AM  Fall Risk  Falls in the past year? 0 0 0 0 0  Was there an injury with Fall? 0 0 0 0 0  Fall Risk Category Calculator 0 0 0 0 0  Fall Risk Category (Retired) Low Low Low    (RETIRED) Patient Fall Risk Level Low fall risk Low fall risk Low fall risk    Patient at Risk for Falls Due to No Fall Risks No Fall Risks No Fall Risks No Fall Risks No Fall Risks  Fall risk Follow up Falls evaluation completed Falls evaluation completed Falls evaluation completed Falls evaluation completed Falls evaluation completed   Functional Status Survey:    Vitals:   03/14/23 0855  BP: 138/78  Pulse: 85  Resp: 20  Temp: (!) 96.3 F (35.7 C)  SpO2: 98%  Weight: 212 lb 12.8 oz (96.5 kg)  Height: 5\' 9"  (1.753 m)   Body mass index is 31.43 kg/m. Physical Exam Vitals reviewed.  Constitutional:      General: She is not in acute distress.    Appearance: Normal appearance. She is obese. She is not ill-appearing or diaphoretic.  HENT:     Head: Normocephalic.     Right Ear: Tympanic membrane, ear canal and external ear normal. There is no impacted cerumen.      Left Ear: Tympanic membrane, ear canal and external ear normal. There is no impacted cerumen.     Nose: Nose normal. No congestion or rhinorrhea.     Mouth/Throat:     Mouth: Mucous membranes are moist.     Pharynx: Oropharynx is clear. No oropharyngeal exudate or posterior oropharyngeal erythema.  Eyes:     General: No scleral icterus.       Right eye: No discharge.        Left eye: No discharge.     Extraocular Movements: Extraocular movements intact.     Conjunctiva/sclera: Conjunctivae normal.     Pupils: Pupils are equal, round, and reactive to light.  Neck:     Vascular: No carotid bruit.  Cardiovascular:     Rate and Rhythm: Normal rate and regular rhythm.     Pulses: Normal pulses.     Heart sounds: Normal heart sounds. No murmur heard.  No friction rub. No gallop.  Pulmonary:     Effort: Pulmonary effort is normal. No respiratory distress.     Breath sounds: Normal breath sounds. No wheezing, rhonchi or rales.  Chest:     Chest wall: No tenderness.  Abdominal:     General: Bowel sounds are normal. There is no distension.     Palpations: Abdomen is soft. There is no mass.     Tenderness: There is no abdominal tenderness. There is no right CVA tenderness, left CVA tenderness, guarding or rebound.  Musculoskeletal:        General: No swelling or tenderness. Normal range of motion.     Cervical back: Normal range of motion. No rigidity or tenderness.     Right lower leg: No edema.     Left lower leg: No edema.  Lymphadenopathy:     Cervical: No cervical adenopathy.  Skin:    General: Skin is warm and dry.     Coloration: Skin is not pale.     Findings: No bruising, erythema, lesion or rash.  Neurological:     Mental Status: She is alert and oriented to person, place, and time.     Cranial Nerves: No cranial nerve deficit.     Sensory: No sensory deficit.     Motor: No weakness.     Coordination: Coordination normal.     Gait: Gait normal.  Psychiatric:         Mood and Affect: Mood normal.        Speech: Speech normal.        Behavior: Behavior normal.        Thought Content: Thought content normal.        Judgment: Judgment normal.     Labs reviewed: Recent Labs    08/24/22 0805 03/10/23 0808  NA 140 139  K 4.3 4.2  CL 109 104  CO2 23 27  GLUCOSE 106* 109*  BUN 18 23  CREATININE 0.85 0.89  CALCIUM 8.9 9.4   Recent Labs    08/24/22 0805 03/10/23 0808  AST 14 14  ALT 15 15  BILITOT 0.4 0.4  PROT 6.6 7.1   Recent Labs    08/24/22 0805 03/10/23 0808  WBC 3.0* 3.6*  NEUTROABS 1,017* 1,544  HGB 11.9 12.7  HCT 35.9 39.2  MCV 89.8 90.3  PLT 227 269   Lab Results  Component Value Date   TSH 3.22 03/10/2023   Lab Results  Component Value Date   HGBA1C 5.9 (H) 03/10/2023   Lab Results  Component Value Date   CHOL 195 03/10/2023   HDL 103 03/10/2023   LDLCALC 80 03/10/2023   TRIG 50 03/10/2023   CHOLHDL 1.9 03/10/2023    Significant Diagnostic Results in last 30 days:  No results found.  Assessment/Plan  1. Essential hypertension Blood pressure well-controlled though seems to be much better at home. -Dietary modification and exercise advised -Continue on losartan - TSH; Future - COMPLETE METABOLIC PANEL WITH GFR; Future - CBC with Differential/Platelet; Future - losartan (COZAAR) 50 MG tablet; Take 1 tablet (50 mg total) by mouth daily.  Dispense: 90 tablet; Refill: 1  2. Hyperlipidemia LDL goal <100 LDL at goal -Continue with dietary modification and exercise -Continue on Lovaza daily - Lipid panel; Future  3. Prediabetes Lab Results  Component Value Date   HGBA1C 5.9 (H) 03/10/2023  -Dietary modification and exercise 30 minutes at least 3 times per week - Hemoglobin A1c; Future  4. Need for  influenza vaccination Afebrile Flu shot administered by CMA no acute reaction reported.  - Flu Vaccine Trivalent High Dose (Fluad)  Family/ staff Communication: Reviewed plan of care with patient  verbalized understanding  Labs/tests ordered:  - TSH; Future - COMPLETE METABOLIC PANEL WITH GFR; Future - CBC with Differential/Platelet; Future - Hemoglobin A1c; Future - Lipid panel; Future  Next Appointment : Return in about 6 months (around 09/12/2023) for medical mangement of chronic issues., Fasting labs in 6 months prior to visit.   Caesar Bookman, NP

## 2023-03-16 ENCOUNTER — Ambulatory Visit
Admission: RE | Admit: 2023-03-16 | Discharge: 2023-03-16 | Disposition: A | Discharge status: Home / Self Care | Payer: Medicare Other | Source: Ambulatory Visit | Attending: Family | Admitting: Family

## 2023-03-21 ENCOUNTER — Other Ambulatory Visit: Payer: Self-pay | Admitting: Family

## 2023-03-21 DIAGNOSIS — R928 Other abnormal and inconclusive findings on diagnostic imaging of breast: Secondary | ICD-10-CM

## 2023-04-10 ENCOUNTER — Ambulatory Visit
Admission: RE | Admit: 2023-04-10 | Discharge: 2023-04-10 | Disposition: A | Discharge status: Home / Self Care | Payer: Medicare Other | Source: Ambulatory Visit | Attending: Family | Admitting: Family

## 2023-04-10 DIAGNOSIS — R928 Other abnormal and inconclusive findings on diagnostic imaging of breast: Secondary | ICD-10-CM

## 2023-04-29 ENCOUNTER — Other Ambulatory Visit: Payer: Self-pay | Admitting: Family

## 2023-04-29 DIAGNOSIS — I10 Essential (primary) hypertension: Secondary | ICD-10-CM

## 2023-06-17 ENCOUNTER — Other Ambulatory Visit: Payer: Self-pay | Admitting: Family

## 2023-06-17 DIAGNOSIS — I10 Essential (primary) hypertension: Secondary | ICD-10-CM

## 2023-09-07 ENCOUNTER — Other Ambulatory Visit

## 2023-09-07 ENCOUNTER — Other Ambulatory Visit: Payer: Self-pay

## 2023-09-07 DIAGNOSIS — R7303 Prediabetes: Secondary | ICD-10-CM

## 2023-09-07 DIAGNOSIS — I10 Essential (primary) hypertension: Secondary | ICD-10-CM

## 2023-09-07 DIAGNOSIS — E785 Hyperlipidemia, unspecified: Secondary | ICD-10-CM

## 2023-09-08 ENCOUNTER — Other Ambulatory Visit: Payer: Medicare Other

## 2023-09-08 LAB — HEMOGLOBIN A1C
Hgb A1c MFr Bld: 6 % — ABNORMAL HIGH (ref <5.7)
Mean Plasma Glucose: 126 mg/dL
eAG (mmol/L): 7 mmol/L

## 2023-09-08 LAB — TSH: TSH: 3.64 m[IU]/L (ref 0.40–4.50)

## 2023-09-08 LAB — COMPLETE METABOLIC PANEL WITHOUT GFR
AG Ratio: 1.6 (calc) (ref 1.0–2.5)
ALT: 13 U/L (ref 6–29)
AST: 14 U/L (ref 10–35)
Albumin: 4.3 g/dL (ref 3.6–5.1)
Alkaline phosphatase (APISO): 57 U/L (ref 37–153)
BUN: 20 mg/dL (ref 7–25)
CO2: 26 mmol/L (ref 20–32)
Calcium: 9.4 mg/dL (ref 8.6–10.4)
Chloride: 105 mmol/L (ref 98–110)
Creat: 0.94 mg/dL (ref 0.60–1.00)
Globulin: 2.7 g/dL (ref 1.9–3.7)
Glucose, Bld: 111 mg/dL — ABNORMAL HIGH (ref 65–99)
Potassium: 4.2 mmol/L (ref 3.5–5.3)
Sodium: 140 mmol/L (ref 135–146)
Total Bilirubin: 0.3 mg/dL (ref 0.2–1.2)
Total Protein: 7 g/dL (ref 6.1–8.1)

## 2023-09-08 LAB — LIPID PANEL
Cholesterol: 207 mg/dL — ABNORMAL HIGH (ref <200)
HDL: 92 mg/dL (ref >=50)
LDL Cholesterol (Calc): 101 mg/dL — ABNORMAL HIGH
Non-HDL Cholesterol (Calc): 115 mg/dL (ref <130)
Total CHOL/HDL Ratio: 2.3 (calc) (ref <5.0)
Triglycerides: 58 mg/dL (ref <150)

## 2023-09-08 LAB — CBC WITH DIFFERENTIAL/PLATELET
Absolute Lymphocytes: 1599 {cells}/uL (ref 850–3900)
Absolute Monocytes: 372 {cells}/uL (ref 200–950)
Basophils Absolute: 29 {cells}/uL (ref 0–200)
Basophils Relative: 0.4 %
Eosinophils Absolute: 73 {cells}/uL (ref 15–500)
Eosinophils Relative: 1 %
HCT: 37.6 % (ref 35.0–45.0)
Hemoglobin: 12.5 g/dL (ref 11.7–15.5)
MCH: 29.6 pg (ref 27.0–33.0)
MCHC: 33.2 g/dL (ref 32.0–36.0)
MCV: 89.1 fL (ref 80.0–100.0)
MPV: 9.4 fL (ref 7.5–12.5)
Monocytes Relative: 5.1 %
Neutro Abs: 5227 {cells}/uL (ref 1500–7800)
Neutrophils Relative %: 71.6 %
Platelets: 263 10*3/uL (ref 140–400)
RBC: 4.22 10*6/uL (ref 3.80–5.10)
RDW: 13.4 % (ref 11.0–15.0)
Total Lymphocyte: 21.9 %
WBC: 7.3 10*3/uL (ref 3.8–10.8)

## 2023-09-12 ENCOUNTER — Ambulatory Visit (INDEPENDENT_AMBULATORY_CARE_PROVIDER_SITE_OTHER): Payer: Medicare Other | Admitting: Family

## 2023-09-12 ENCOUNTER — Encounter: Payer: Self-pay | Admitting: Family

## 2023-09-12 VITALS — BP 138/88 | HR 95 | Temp 97.8°F | Resp 20 | Ht 69.0 in | Wt 234.6 lb

## 2023-09-12 DIAGNOSIS — E785 Hyperlipidemia, unspecified: Secondary | ICD-10-CM

## 2023-09-12 DIAGNOSIS — I8390 Asymptomatic varicose veins of unspecified lower extremity: Secondary | ICD-10-CM | POA: Diagnosis not present

## 2023-09-12 DIAGNOSIS — I10 Essential (primary) hypertension: Secondary | ICD-10-CM | POA: Diagnosis not present

## 2023-09-12 DIAGNOSIS — R7303 Prediabetes: Secondary | ICD-10-CM | POA: Diagnosis not present

## 2023-09-12 DIAGNOSIS — F4321 Adjustment disorder with depressed mood: Secondary | ICD-10-CM

## 2023-09-12 NOTE — Progress Notes (Signed)
 Provider: Christean Courts FNP-C   Jeralynn Vaquera, Elijio Guadeloupe, NP  Patient Care Team: Tannie Koskela, Elijio Guadeloupe, NP as PCP - General (Family Medicine)  Extended Emergency Contact Information Primary Emergency Contact: Priego,[name] Address: [address]          [place], [place] [address] [place] Home Phone: [phone] Mobile Phone: [phone] Relation: Son Secondary Emergency Contact: Mackins,frankie Address: [address] [place], [place] [address] [place] Home Phone: [phone] Work Phone: [phone] Mobile Phone: [phone] Relation: Daughter  Code Status:  Full Code  Goals of care: Advanced Directive information    09/12/2023    9:04 AM  Advanced Directives  Does Patient Have a Medical Advance Directive? No  Would patient like information on creating a medical advance directive? No - Patient declined     Chief Complaint  Patient presents with   Medical Management of Chronic Issues    6 month follow up.    Discussed the use of AI scribe software for clinical note transcription with the patient, who gave verbal consent to proceed.  History of Present Illness   Tammy  Schiano "Conny Del" is a 73 year old female who presents for a six-month follow-up visit.  She experiences anxiety during clinic visits, leading to elevated blood pressure readings, while her home readings are generally better. Her home blood pressure readings include a low of 96/58 mmHg, which occurred when she felt unusually tired, possibly due to dehydration. She is currently taking losartan  50 mg daily.she denies any headache,dizziness,vision changes,fatigue,chest tightness,palpitation,chest pain or shortness of breath.      Over the past six months, she has gained weight, which she attributes to emotional eating following the loss of a close friend. She has been consuming comfort foods like potatoes and chocolate. Her physical activity has decreased; she previously walked  2,070 miles in 2024 but has not maintained this level of activity recently. She wants to return to her previous routine but finds it challenging.  Recent lab work showed an increase in glucose to 111 mg/dL and Q2V to 9.5%. Her total cholesterol increased to 207 mg/dL, with LDL rising from 80 to 101 mg/dL.  She feels achy after physical exertion, such as lifting heavy objects while gardening, and notes that her legs sometimes swell, which she attributes to decreased walking. She experiences achiness in her legs after walking long distances but finds that pausing and continuing helps alleviate the discomfort.  She is currently taking a multivitamin, calcium with vitamin D, losartan  50 mg, fish oil 1 gram, and Opfibram. Her medications are on automatic refill, and she has about a month left of losartan .  She continues to work from home and plans to delay retirement until her colleague, who is eight years younger, retires. She finds working preferable to staying at home without activities. She has a family history of longevity, with her grandmother living to 19, her aunt to 84, and both parents into their 24s.    Past Medical History:  Diagnosis Date   Arthritis    History of colonoscopy 2020   Hypertension    History reviewed. No pertinent surgical history.  Allergies  Allergen Reactions   Other Hives and Rash    Poison Ivy    Allergies as of 09/12/2023       Reactions   Other Hives, Rash   Poison Ivy        Medication List        Accurate  as of September 12, 2023  9:41 AM. If you have any questions, ask your nurse or doctor.          losartan  50 MG tablet Commonly known as: COZAAR  TAKE 1 TABLET(50 MG) BY MOUTH DAILY   multivitamin with minerals Tabs tablet Take 1 tablet by mouth daily.   omega-3 acid ethyl esters 1 g capsule Commonly known as: LOVAZA  Take 1 capsule (1 g total) by mouth daily.   OPTIFIBER LEAN PO Take by mouth.   ULTRA CALCIUM + VITAMIN D3 PO Take  1,000 mg by mouth daily.        Review of Systems  Constitutional:  Negative for appetite change, chills, fatigue, fever and unexpected weight change.  HENT:  Negative for congestion, dental problem, ear discharge, ear pain, facial swelling, hearing loss, nosebleeds, postnasal drip, rhinorrhea, sinus pressure, sinus pain, sneezing, sore throat, tinnitus and trouble swallowing.   Eyes:  Negative for pain, discharge, redness, itching and visual disturbance.  Respiratory:  Negative for cough, chest tightness, shortness of breath and wheezing.   Cardiovascular:  Negative for chest pain, palpitations and leg swelling.  Gastrointestinal:  Negative for abdominal distention, abdominal pain, blood in stool, constipation, diarrhea, nausea and vomiting.  Endocrine: Negative for cold intolerance, heat intolerance, polydipsia, polyphagia and polyuria.  Genitourinary:  Negative for difficulty urinating, dysuria, flank pain, frequency and urgency.  Musculoskeletal:  Positive for arthralgias. Negative for back pain, gait problem, joint swelling, myalgias, neck pain and neck stiffness.  Skin:  Negative for color change, pallor, rash and wound.  Neurological:  Negative for dizziness, syncope, speech difficulty, weakness, light-headedness, numbness and headaches.  Hematological:  Does not bruise/bleed easily.  Psychiatric/Behavioral:  Negative for agitation, behavioral problems, confusion, hallucinations, self-injury, sleep disturbance and suicidal ideas. The patient is not nervous/anxious.        Grieving loss of her friend     Immunization History  Administered Date(s) Administered   Fluad Quad(high Dose 65+) 01/28/2021, 03/01/2022   Fluad Trivalent(High Dose 65+) 03/14/2023   PFIZER(Purple Top)SARS-COV-2 Vaccination 06/15/2019, 07/06/2019, 05/19/2020   PNEUMOCOCCAL CONJUGATE-20 08/29/2022   Pneumococcal Polysaccharide-23 01/12/2021   Tdap 11/16/2020   Pertinent  Health Maintenance Due  Topic Date  Due   INFLUENZA VACCINE  12/22/2023   MAMMOGRAM  03/15/2025   Colonoscopy  10/18/2028   DEXA SCAN  Completed      08/30/2021   10:59 AM 03/01/2022    8:57 AM 08/29/2022    9:50 AM 03/14/2023    8:58 AM 09/12/2023    9:03 AM  Fall Risk  Falls in the past year? 0 0 0 0 1  Was there an injury with Fall? 0 0 0 0 0  Fall Risk Category Calculator 0 0 0 0 1  Fall Risk Category (Retired) Low Low     (RETIRED) Patient Fall Risk Level Low fall risk Low fall risk     Patient at Risk for Falls Due to No Fall Risks No Fall Risks No Fall Risks No Fall Risks No Fall Risks  Fall risk Follow up Falls evaluation completed Falls evaluation completed Falls evaluation completed Falls evaluation completed Falls evaluation completed   Functional Status Survey:    Vitals:   09/12/23 0909  BP: 138/88  Pulse: 95  Resp: 20  Temp: 97.8 F (36.6 C)  SpO2: 99%  Weight: 234 lb 9.6 oz (106.4 kg)  Height: 5\' 9"  (1.753 m)   Body mass index is 34.64 kg/m. Physical Exam VITALS: T- 97.8, SaO2- 99% MEASUREMENTS:  Weight- 213. GENERAL: Alert, cooperative, well developed, no acute distress HEENT: Normocephalic, normal oropharynx, moist mucous membranes, tongue with lesion, no sinus tenderness NECK: Supple CHEST: Clear to auscultation bilaterally, no wheezes, rhonchi, or crackles, non-tender CARDIOVASCULAR: Normal heart rate and rhythm, S1 and S2 normal without murmurs ABDOMEN: Soft, non-tender, non-distended, without organomegaly, normal bowel sounds EXTREMITIES: Mild leg edema, no cyanosis NEUROLOGICAL: Cranial nerves grossly intact, moves all extremities without gross motor or sensory deficit  SKIN: No rash,no lesion or erythema   PSYCHIATRY/BEHAVIORAL: Tearful due to loss of her friend    Labs reviewed: Recent Labs    03/10/23 0808 09/07/23 0809  NA 139 140  K 4.2 4.2  CL 104 105  CO2 27 26  GLUCOSE 109* 111*  BUN 23 20  CREATININE 0.89 0.94  CALCIUM 9.4 9.4   Recent Labs    03/10/23 0808  09/07/23 0809  AST 14 14  ALT 15 13  BILITOT 0.4 0.3  PROT 7.1 7.0   Recent Labs    03/10/23 0808 09/07/23 0809  WBC 3.6* 7.3  NEUTROABS 1,544 5,227  HGB 12.7 12.5  HCT 39.2 37.6  MCV 90.3 89.1  PLT 269 263   Lab Results  Component Value Date   TSH 3.64 09/07/2023   Lab Results  Component Value Date   HGBA1C 6.0 (H) 09/07/2023   Lab Results  Component Value Date   CHOL 207 (H) 09/07/2023   HDL 92 09/07/2023   LDLCALC 101 (H) 09/07/2023   TRIG 58 09/07/2023   CHOLHDL 2.3 09/07/2023    Significant Diagnostic Results in last 30 days:  No results found.  Assessment/Plan     Wellness Visit Routine wellness visit. She is experiencing grief-related depression due to the loss of a friend, impacting her physical activity and diet. Weight gain since the last visit and struggles with motivation to exercise. Blood pressure readings at home are generally good, but elevated in the clinic due to anxiety. Laboratory results show elevated glucose, A1c, and cholesterol levels. She is aware of the need to improve diet and exercise habits. - Encourage resumption of regular physical activity, starting with gradual increases and incorporating stretching. - Advise on dietary modifications, including reducing high-carbohydrate snacks and incorporating healthier options like apples, blueberries, carrots, cucumbers, and celery. - Discuss the importance of hydration and encourage increased water intake. - Recheck labs in six months. - Schedule follow-up appointment in six months.  Grief-related depression Experiencing grief-related depression following the loss of a friend, leading to decreased motivation for physical activity and changes in dietary habits. No current treatment for depression, but she is aware of the need to address these feelings. - Encourage engagement in physical activity and social interactions to improve mood. - Advise on seeking support from friends or mental health  professionals if feelings of depression persist.  Prediabetes  Glucose level is 111 mg/dL and W0J has increased from 5.9% to 6.0%, likely related to dietary habits, including increased carbohydrate intake and decreased physical activity. - Advise on dietary modifications to reduce carbohydrate intake and improve glycemic control. - Encourage regular physical activity to help manage glucose levels.  Hyperlipidemia Total cholesterol has increased from 195 mg/dL to 811 mg/dL, and LDL has increased from 80 mg/dL to 914 mg/dL. Triglycerides remain within normal range. Dietary habits and decreased physical activity are contributing factors. - Advise on dietary modifications to reduce cholesterol levels, including reducing high-carbohydrate and high-fat snacks. - Encourage regular physical activity to help manage cholesterol levels.  Essential Hypertension  Blood pressure is generally well-controlled at home, but elevated in the clinic due to anxiety. Home readings are satisfactory, with occasional low readings when feeling tired. No significant symptoms associated with low blood pressure readings. - Continue current antihypertensive medication (losartan  50 mg). - Monitor blood pressure at home regularly.  Varicose vein  Mild leg swelling, likely due to decreased physical activity. No significant pain or pitting edema observed. - Encourage regular physical activity to improve circulation and reduce swelling. - Advise on taking breaks and elevating legs when sitting for prolonged periods.   Family/ staff Communication: Reviewed plan of care with patient verbalized understanding   Labs/tests ordered:  - CBC with Differential/Platelet - CMP with eGFR([facility]) - TSH - Hgb A1C - Lipid panel   Next Appointment : Return in about 6 months (around 03/13/2024) for medical mangement of chronic issues., Fasting labs in 6 months prior to visit.   Spent 30 minutes of Face to face and non-face to face  with patient  >50% time spent counseling; reviewing medical record; tests; labs; documentation and developing future plan of care.   Estil Heman, NP

## 2023-09-28 ENCOUNTER — Other Ambulatory Visit: Payer: Self-pay | Admitting: Family

## 2023-09-28 DIAGNOSIS — I10 Essential (primary) hypertension: Secondary | ICD-10-CM

## 2023-09-28 MED ORDER — LOSARTAN POTASSIUM 50 MG PO TABS: 50.0000 mg | ORAL_TABLET | Freq: Every day | ORAL | 1 refills | Status: DC

## 2023-09-28 NOTE — Telephone Encounter (Signed)
 Copied from CRM [id number]. Topic: Clinical - Medication Refill >> Sep 28, 2023  9:46 AM Retta Caster wrote: Medication: losartan  (COZAAR ) 50 MG tablet   Has the patient contacted their pharmacy? Yes (Agent: If no, request that the patient contact the pharmacy for the refill. If patient does not wish to contact the pharmacy document the reason why and proceed with request.) (Agent: If yes, when and what did the pharmacy advise?)  This is the patient's preferred pharmacy:  [id number] Jonette Nestle, [place] - [address] AT [facility] [address] [place] [address] Phone: [phone] Fax: [phone]  Is this the correct pharmacy for this prescription? Yes If no, delete pharmacy and type the correct one.   Has the prescription been filled recently? No  Is the patient out of the medication? No 6 pills left  Has the patient been seen for an appointment in the last year OR does the patient have an upcoming appointment? Yes  Can we respond through MyChart? Yes  Agent: Please be advised that Rx refills may take up to 3 business days. We ask that you follow-up with your pharmacy.

## 2024-02-13 ENCOUNTER — Other Ambulatory Visit: Payer: Self-pay | Admitting: Family

## 2024-02-13 DIAGNOSIS — Z1231 Encounter for screening mammogram for malignant neoplasm of breast: Secondary | ICD-10-CM

## 2024-03-08 ENCOUNTER — Other Ambulatory Visit

## 2024-03-08 DIAGNOSIS — E785 Hyperlipidemia, unspecified: Secondary | ICD-10-CM

## 2024-03-08 DIAGNOSIS — R7303 Prediabetes: Secondary | ICD-10-CM

## 2024-03-08 DIAGNOSIS — I10 Essential (primary) hypertension: Secondary | ICD-10-CM

## 2024-03-09 LAB — COMPREHENSIVE METABOLIC PANEL WITH GFR
AG Ratio: 1.5 (calc) (ref 1.0–2.5)
ALT: 14 U/L (ref 6–29)
AST: 15 U/L (ref 10–35)
Albumin: 4.3 g/dL (ref 3.6–5.1)
Alkaline phosphatase (APISO): 51 U/L (ref 37–153)
BUN: 23 mg/dL (ref 7–25)
CO2: 27 mmol/L (ref 20–32)
Calcium: 9.7 mg/dL (ref 8.6–10.4)
Chloride: 106 mmol/L (ref 98–110)
Creat: 0.97 mg/dL (ref 0.60–1.00)
Globulin: 2.9 g/dL (ref 1.9–3.7)
Glucose, Bld: 106 mg/dL — ABNORMAL HIGH (ref 65–99)
Potassium: 4.5 mmol/L (ref 3.5–5.3)
Sodium: 142 mmol/L (ref 135–146)
Total Bilirubin: 0.4 mg/dL (ref 0.2–1.2)
Total Protein: 7.2 g/dL (ref 6.1–8.1)
eGFR: 62 mL/min/1.73m2 (ref >=60)

## 2024-03-09 LAB — CBC WITH DIFFERENTIAL/PLATELET
Absolute Lymphocytes: 1904 {cells}/uL (ref 850–3900)
Absolute Monocytes: 381 {cells}/uL (ref 200–950)
Basophils Absolute: 50 {cells}/uL (ref 0–200)
Basophils Relative: 0.9 %
Eosinophils Absolute: 112 {cells}/uL (ref 15–500)
Eosinophils Relative: 2 %
HCT: 40.9 % (ref 35.0–45.0)
Hemoglobin: 13.3 g/dL (ref 11.7–15.5)
MCH: 29.5 pg (ref 27.0–33.0)
MCHC: 32.5 g/dL (ref 32.0–36.0)
MCV: 90.7 fL (ref 80.0–100.0)
MPV: 9.7 fL (ref 7.5–12.5)
Monocytes Relative: 6.8 %
Neutro Abs: 3153 {cells}/uL (ref 1500–7800)
Neutrophils Relative %: 56.3 %
Platelets: 204 Thousand/uL (ref 140–400)
RBC: 4.51 Million/uL (ref 3.80–5.10)
RDW: 13.4 % (ref 11.0–15.0)
Total Lymphocyte: 34 %
WBC: 5.6 Thousand/uL (ref 3.8–10.8)

## 2024-03-09 LAB — TSH: TSH: 3.87 m[IU]/L (ref 0.40–4.50)

## 2024-03-09 LAB — HEMOGLOBIN A1C
Hgb A1c MFr Bld: 5.9 % — ABNORMAL HIGH (ref <5.7)
Mean Plasma Glucose: 123 mg/dL
eAG (mmol/L): 6.8 mmol/L

## 2024-03-09 LAB — LIPID PANEL
Cholesterol: 233 mg/dL — ABNORMAL HIGH (ref <200)
HDL: 100 mg/dL (ref >=50)
LDL Cholesterol (Calc): 118 mg/dL — ABNORMAL HIGH
Non-HDL Cholesterol (Calc): 133 mg/dL — ABNORMAL HIGH (ref <130)
Total CHOL/HDL Ratio: 2.3 (calc) (ref <5.0)
Triglycerides: 56 mg/dL (ref <150)

## 2024-03-13 ENCOUNTER — Ambulatory Visit: Admitting: Family

## 2024-03-13 ENCOUNTER — Ambulatory Visit: Payer: Self-pay | Admitting: Family

## 2024-03-13 ENCOUNTER — Encounter: Payer: Self-pay | Admitting: Family

## 2024-03-13 VITALS — BP 144/88 | HR 98 | Temp 97.6°F | Resp 20 | Ht 69.0 in | Wt 255.2 lb

## 2024-03-13 DIAGNOSIS — Z23 Encounter for immunization: Secondary | ICD-10-CM

## 2024-03-13 DIAGNOSIS — Z0000 Encounter for general adult medical examination without abnormal findings: Secondary | ICD-10-CM

## 2024-03-13 DIAGNOSIS — I10 Essential (primary) hypertension: Secondary | ICD-10-CM | POA: Diagnosis not present

## 2024-03-13 MED ORDER — LOSARTAN POTASSIUM 50 MG PO TABS: 50.0000 mg | ORAL_TABLET | Freq: Every day | ORAL | 1 refills | Status: AC

## 2024-03-13 NOTE — Patient Instructions (Signed)
 Tammy Fitzpatrick , Thank you for taking time to come for your Medicare Wellness Visit. I appreciate your ongoing commitment to your health goals. Please review the following plan we discussed and let me know if I can assist you in the future.   Screening recommendations/referrals: Colonoscopy : Up to date  Mammogram  : Has upcoming appointment  Bone Density  : Up to date  Recommended yearly ophthalmology/optometry visit for glaucoma screening and checkup Recommended yearly dental visit for hygiene and checkup  Vaccinations: Influenza vaccine- due annually in September/October Pneumococcal vaccine  : Up to date  Tdap vaccine  : Up to date  Shingles vaccine  : Up to date     Advanced directives: Please bring a copy   Conditions/risks identified: advanced age (>38men, >63 women);obesity (BMI >30kg/m2);smoking/ tobacco exposure;hypertension  Next appointment: 1 year    Preventive Care 73 Years and Older, Female Preventive care refers to lifestyle choices and visits with your health care provider that can promote health and wellness. What does preventive care include? A yearly physical exam. This is also called an annual well check. Dental exams once or twice a year. Routine eye exams. Ask your health care provider how often you should have your eyes checked. Personal lifestyle choices, including: Daily care of your teeth and gums. Regular physical activity. Eating a healthy diet. Avoiding tobacco and drug use. Limiting alcohol use. Practicing safe sex. Taking low-dose aspirin every day. Taking vitamin and mineral supplements as recommended by your health care provider. What happens during an annual well check? The services and screenings done by your health care provider during your annual well check will depend on your age, overall health, lifestyle risk factors, and family history of disease. Counseling  Your health care provider may ask you questions about your: Alcohol  use. Tobacco use. Drug use. Emotional well-being. Home and relationship well-being. Sexual activity. Eating habits. History of falls. Memory and ability to understand (cognition). Work and work Astronomer. Reproductive health. Screening  You may have the following tests or measurements: Height, weight, and BMI. Blood pressure. Lipid and cholesterol levels. These may be checked every 5 years, or more frequently if you are over 62 years old. Skin check. Lung cancer screening. You may have this screening every year starting at age 59 if you have a 30-pack-year history of smoking and currently smoke or have quit within the past 15 years. Fecal occult blood test (FOBT) of the stool. You may have this test every year starting at age 72. Flexible sigmoidoscopy or colonoscopy. You may have a sigmoidoscopy every 5 years or a colonoscopy every 10 years starting at age 15. Hepatitis C blood test. Hepatitis B blood test. Sexually transmitted disease (STD) testing. Diabetes screening. This is done by checking your blood sugar (glucose) after you have not eaten for a while (fasting). You may have this done every 1-3 years. Bone density scan. This is done to screen for osteoporosis. You may have this done starting at age 50. Mammogram. This may be done every 1-2 years. Talk to your health care provider about how often you should have regular mammograms. Talk with your health care provider about your test results, treatment options, and if necessary, the need for more tests. Vaccines  Your health care provider may recommend certain vaccines, such as: Influenza vaccine. This is recommended every year. Tetanus, diphtheria, and acellular pertussis (Tdap, Td) vaccine. You may need a Td booster every 10 years. Zoster vaccine. You may need this after age 57.  Pneumococcal 13-valent conjugate (PCV13) vaccine. One dose is recommended after age 8. Pneumococcal polysaccharide (PPSV23) vaccine. One dose is  recommended after age 6. Talk to your health care provider about which screenings and vaccines you need and how often you need them. This information is not intended to replace advice given to you by your health care provider. Make sure you discuss any questions you have with your health care provider. Document Released: 06/05/2015 Document Revised: 01/27/2016 Document Reviewed: 03/10/2015 Elsevier Interactive Patient Education  2017 [employer].  Fall Prevention in the Home Falls can cause injuries. They can happen to people of all ages. There are many things you can do to make your home safe and to help prevent falls. What can I do on the outside of my home? Regularly fix the edges of walkways and driveways and fix any cracks. Remove anything that might make you trip as you walk through a door, such as a raised step or threshold. Trim any bushes or trees on the path to your home. Use bright outdoor lighting. Clear any walking paths of anything that might make someone trip, such as rocks or tools. Regularly check to see if handrails are loose or broken. Make sure that both sides of any steps have handrails. Any raised decks and porches should have guardrails on the edges. Have any leaves, snow, or ice cleared regularly. Use sand or salt on walking paths during winter. Clean up any spills in your garage right away. This includes oil or grease spills. What can I do in the bathroom? Use night lights. Install grab bars by the toilet and in the tub and shower. Do not use towel bars as grab bars. Use non-skid mats or decals in the tub or shower. If you need to sit down in the shower, use a plastic, non-slip stool. Keep the floor dry. Clean up any water that spills on the floor as soon as it happens. Remove soap buildup in the tub or shower regularly. Attach bath mats securely with double-sided non-slip rug tape. Do not have throw rugs and other things on the floor that can make you  trip. What can I do in the bedroom? Use night lights. Make sure that you have a light by your bed that is easy to reach. Do not use any sheets or blankets that are too big for your bed. They should not hang down onto the floor. Have a firm chair that has side arms. You can use this for support while you get dressed. Do not have throw rugs and other things on the floor that can make you trip. What can I do in the kitchen? Clean up any spills right away. Avoid walking on wet floors. Keep items that you use a lot in easy-to-reach places. If you need to reach something above you, use a strong step stool that has a grab bar. Keep electrical cords out of the way. Do not use floor polish or wax that makes floors slippery. If you must use wax, use non-skid floor wax. Do not have throw rugs and other things on the floor that can make you trip. What can I do with my stairs? Do not leave any items on the stairs. Make sure that there are handrails on both sides of the stairs and use them. Fix handrails that are broken or loose. Make sure that handrails are as long as the stairways. Check any carpeting to make sure that it is firmly attached to the stairs. Fix any  carpet that is loose or worn. Avoid having throw rugs at the top or bottom of the stairs. If you do have throw rugs, attach them to the floor with carpet tape. Make sure that you have a light switch at the top of the stairs and the bottom of the stairs. If you do not have them, ask someone to add them for you. What else can I do to help prevent falls? Wear shoes that: Do not have high heels. Have rubber bottoms. Are comfortable and fit you well. Are closed at the toe. Do not wear sandals. If you use a stepladder: Make sure that it is fully opened. Do not climb a closed stepladder. Make sure that both sides of the stepladder are locked into place. Ask someone to hold it for you, if possible. Clearly mark and make sure that you can  see: Any grab bars or handrails. First and last steps. Where the edge of each step is. Use tools that help you move around (mobility aids) if they are needed. These include: Canes. Walkers. Scooters. Crutches. Turn on the lights when you go into a dark area. Replace any light bulbs as soon as they burn out. Set up your furniture so you have a clear path. Avoid moving your furniture around. If any of your floors are uneven, fix them. If there are any pets around you, be aware of where they are. Review your medicines with your doctor. Some medicines can make you feel dizzy. This can increase your chance of falling. Ask your doctor what other things that you can do to help prevent falls. This information is not intended to replace advice given to you by your health care provider. Make sure you discuss any questions you have with your health care provider. Document Released: 03/05/2009 Document Revised: 10/15/2015 Document Reviewed: 06/13/2014 Elsevier Interactive Patient Education  2017 [employer].

## 2024-03-13 NOTE — Progress Notes (Signed)
 Subjective:   Tammy  Fitzpatrick is a 73 y.o. female who presents for Medicare Annual (Subsequent) preventive examination.  Visit Complete: In person  Patient Medicare AWV questionnaire was completed by the patient on 03/13/2024; I have confirmed that all information answered by patient is correct and no changes since this date.  Cardiac Risk Factors include: advanced age (>83men, >19 women);obesity (BMI >30kg/m2);smoking/ tobacco exposure;hypertension     Objective:    Today's Vitals   03/13/24 0902 03/13/24 0905  BP: (!) 144/88   Pulse: 98   Resp: 20   Temp: 97.6 F (36.4 C)   SpO2: 100%   Weight: 255 lb 3.2 oz (115.8 kg)   Height: 5' 9 (1.753 m)   PainSc:  2    Body mass index is 37.69 kg/m.     03/13/2024    8:57 AM 09/12/2023    9:04 AM 03/01/2022    8:57 AM 08/30/2021   10:59 AM 04/28/2021   11:00 AM 01/28/2021   10:32 AM 01/12/2021    1:43 PM  Advanced Directives  Does Patient Have a Medical Advance Directive? Yes No No No No No No  Type of Estate agent of [place];Living will        Does patient want to make changes to medical advance directive? No - Patient declined        Copy of Healthcare Power of Attorney in Chart? No - copy requested        Would patient like information on creating a medical advance directive?  No - Patient declined No - Patient declined No - Patient declined No - Patient declined No - Patient declined No - Patient declined    Current Medications (verified) Outpatient Encounter Medications as of 03/13/2024  Medication Sig   Calcium Carb-Cholecalciferol (ULTRA CALCIUM + VITAMIN D3 PO) Take 1,000 mg by mouth daily.   losartan  (COZAAR ) 50 MG tablet Take 1 tablet (50 mg total) by mouth daily.   Multiple Vitamin (MULTIVITAMIN WITH MINERALS) TABS tablet Take 1 tablet by mouth daily.   omega-3 acid ethyl esters (LOVAZA ) 1 g capsule Take 1 capsule (1 g total) by mouth daily.   OPTIFIBER LEAN PO Take by mouth.   No  facility-administered encounter medications on file as of 03/13/2024.    Allergies (verified) Other   History: Past Medical History:  Diagnosis Date   Arthritis    History of colonoscopy 2020   Hypertension    History reviewed. No pertinent surgical history. Family History  Problem Relation Age of Onset   Dementia Mother    Breast cancer Mother 42   Cancer Father    Colon cancer Father    Autoimmune disease Sister    Hypertension Sister    Thyroid disease Daughter    Healthy Son    Breast cancer Paternal Grandmother    Social History   Socioeconomic History   Marital status: Divorced    Spouse name: Not on file   Number of children: Not on file   Years of education: Not on file   Highest education level: Bachelor's degree (e.g., BA, AB, BS)  Occupational History   Not on file  Tobacco Use   Smoking status: Former    Types: Cigarettes   Smokeless tobacco: Never  Vaping Use   Vaping status: Never Used  Substance and Sexual Activity   Alcohol use: Yes    Alcohol/week: 5.0 standard drinks of alcohol    Types: 5 Standard drinks or equivalent per week  Drug use: Never   Sexual activity: Not on file  Other Topics Concern   Not on file  Social History Narrative   Diet:      Caffeine: coffee      Married, if yes what year: Divorced, 4      Do you live in a house, apartment, assisted living, condo, trailer, ect: House      Is it one or more stories: one      How many persons live in your home? 1      Pets: 1 dog and 1 cat      Highest level or education completed: Engineer, maintenance (IT)      Current/Past profession: Environmental health practitioner      Exercise:  No                Type and how often:          Living Will: No     DNR: No     POA/HPOA: No        Functional Status:   Do you have difficulty bathing or dressing yourself? No     Do you have difficulty preparing food or eating? No     Do you have difficulty managing your medications? No     Do  you have difficulty managing your finances? No     Do you have difficulty affording your medications? No     Social Drivers of Corporate investment banker Strain: Low Risk  (03/10/2024)   Overall Financial Resource Strain (CARDIA)    Difficulty of Paying Living Expenses: Not very hard  Food Insecurity: No Food Insecurity (03/10/2024)   Hunger Vital Sign    Worried About Running Out of Food in the Last Year: Never true    Ran Out of Food in the Last Year: Never true  Transportation Needs: No Transportation Needs (03/10/2024)   PRAPARE - Administrator, Civil Service (Medical): No    Lack of Transportation (Non-Medical): No  Physical Activity: Sufficiently Active (03/10/2024)   Exercise Vital Sign    Days of Exercise per Week: 5 days    Minutes of Exercise per Session: 60 min  Stress: No Stress Concern Present (03/10/2024)   [employer] of Occupational Health - Occupational Stress Questionnaire    Feeling of Stress: Only a little  Social Connections: Moderately Isolated (03/10/2024)   Social Connection and Isolation Panel    Frequency of Communication with Friends and Family: Three times a week    Frequency of Social Gatherings with Friends and Family: Twice a week    Attends Religious Services: 1 to 4 times per year    Active Member of [employer] or Organizations: No    Attends Engineer, structural: Not on file    Marital Status: Divorced    Tobacco Counseling Counseling given: Not Answered   Clinical Intake:  Pre-visit preparation completed: No  Pain : 0-10 Pain Score: 2  Pain Type: Chronic pain Pain Location: Generalized Pain Orientation: Other (Comment) Pain Radiating Towards: No Pain Descriptors / Indicators: Aching Pain Onset: More than a month ago Pain Frequency: Intermittent Pain Relieving Factors: Tylenol Effect of Pain on Daily Activities: walking and sleeping  Pain Relieving Factors: Tylenol  BMI - recorded: 37.69 Nutritional  Status: BMI > 30  Obese Nutritional Risks: None Diabetes: No  How often do you need to have someone help you when you read instructions, pamphlets, or other written materials from your doctor or pharmacy?: 1 -  Never What is the last grade level you completed in school?: [employer]  Interpreter Needed?: No      Activities of Daily Living    03/13/2024    9:14 AM  In your present state of health, do you have any difficulty performing the following activities:  Hearing? 0  Vision? 0  Difficulty concentrating or making decisions? 0  Walking or climbing stairs? 0  Dressing or bathing? 0  Doing errands, shopping? 0  Preparing Food and eating ? N  Using the Toilet? N  In the past six months, have you accidently leaked urine? Y  Do you have problems with loss of bowel control? N  Managing your Medications? N  Managing your Finances? N  Housekeeping or managing your Housekeeping? N    Patient Care Team: Shimeka Bacot C, NP as PCP - General (Family Medicine)  Indicate any recent Medical Services you may have received from other than [facility] providers in the past year (date may be approximate).     Assessment:   This is a routine wellness examination for Sher .  Hearing/Vision screen Hearing Screening - Comments:: No hearing issues. Vision Screening - Comments:: Eye exam 03-11-2024 ([facility]) cataract growing.   Goals Addressed             This Visit's Progress    Weight (lb) < 200 lb (90.7 kg)   255 lb 3.2 oz (115.8 kg)    Loss weight < 190 lbs        Depression Screen    03/13/2024    8:59 AM 09/12/2023    9:03 AM 03/14/2023   12:27 PM 08/30/2021   12:02 PM  PHQ 2/9 Scores  PHQ - 2 Score 0 2 0 0  PHQ- 9 Score  5      Fall Risk    03/13/2024    8:59 AM 09/12/2023    9:03 AM 03/14/2023    8:58 AM 08/29/2022    9:50 AM 03/01/2022    8:57 AM  Fall Risk   Falls in the past year? 0 1 0 0 0  Number falls in past yr: 0 0 0 0 0  Injury with Fall? 0 0 0 0 0   Risk for fall due to : No Fall Risks No Fall Risks No Fall Risks No Fall Risks No Fall Risks  Follow up Falls evaluation completed Falls evaluation completed Falls evaluation completed Falls evaluation completed Falls evaluation completed      Data saved with a previous flowsheet row definition    MEDICARE RISK AT HOME: Medicare Risk at Home Any stairs in or around the home?: Yes If so, are there any without handrails?: No Home free of loose throw rugs in walkways, pet beds, electrical cords, etc?: Yes Adequate lighting in your home to reduce risk of falls?: Yes Life alert?: No Use of a cane, walker or w/c?: No Grab bars in the bathroom?: No Shower chair or bench in shower?: No Elevated toilet seat or a handicapped toilet?: No  TIMED UP AND GO:  Was the test performed?  Yes  Length of time to ambulate 10 feet: 5 sec Gait steady and fast without use of assistive device    Cognitive Function:        03/13/2024    8:59 AM  6CIT Screen  What Year? 0 points  What month? 0 points  What time? 0 points  Count back from 20 0 points  Months in reverse 0 points  Repeat  phrase 0 points  Total Score 0 points    Immunizations Immunization History  Administered Date(s) Administered   Fluad Quad(high Dose 65+) 01/28/2021, 03/01/2022   Fluad Trivalent(High Dose 65+) 03/14/2023   INFLUENZA, HIGH DOSE SEASONAL PF 03/13/2024   PFIZER(Purple Top)SARS-COV-2 Vaccination 06/15/2019, 07/06/2019, 05/19/2020   PNEUMOCOCCAL CONJUGATE-20 08/29/2022   Pneumococcal Polysaccharide-23 01/12/2021   Tdap 11/16/2020    TDAP status: Up to date  Flu Vaccine status: Up to date  Pneumococcal vaccine status: Up to date  Covid-19 vaccine status: Declined, Education has been provided regarding the importance of this vaccine but patient still declined. Advised may receive this vaccine at local pharmacy or Health Dept.or vaccine clinic. Aware to provide a copy of the vaccination record if obtained  from local pharmacy or Health Dept. Verbalized acceptance and understanding.  Qualifies for Shingles Vaccine? Yes   Zostavax completed No   Shingrix Completed?: No.    Education has been provided regarding the importance of this vaccine. Patient has been advised to call insurance company to determine out of pocket expense if they have not yet received this vaccine. Advised may also receive vaccine at local pharmacy or Health Dept. Verbalized acceptance and understanding.  Screening Tests Health Maintenance  Topic Date Due   Zoster Vaccines- Shingrix (1 of 2) 05/13/2024 (Originally 01/06/2001)   COVID-19 Vaccine (4 - 2025-26 season) 03/29/2037 (Originally 01/22/2024)   Medicare Annual Wellness (AWV)  03/13/2025   Mammogram  03/15/2025   Colonoscopy  10/18/2028   DTaP/Tdap/Td (2 - Td or Tdap) 11/17/2030   Pneumococcal Vaccine: 50+ Years  Completed   Influenza Vaccine  Completed   DEXA SCAN  Completed   Hepatitis C Screening  Completed   Meningococcal B Vaccine  Aged Out    Health Maintenance  There are no preventive care reminders to display for this patient.   Colorectal cancer screening: Type of screening: Colonoscopy. Completed 10/19/2018. Repeat every 5 years  Mammogram status: Ordered scheduled for 03/18/2024. Pt provided with contact info and advised to call to schedule appt.   Bone Density status: Completed 03/16/2023. Results reflect: Bone density results: OSTEOPENIA. Repeat every 5 years.  Lung Cancer Screening: (Low Dose CT Chest recommended if Age 16-80 years, 20 pack-year currently smoking OR have quit w/in 15years.) does not qualify.   Lung Cancer Screening Referral: Yes   Additional Screening:  Hepatitis C Screening: does qualify; Completed yes   Vision Screening: Recommended annual ophthalmology exams for early detection of glaucoma and other disorders of the eye. Is the patient up to date with their annual eye exam?  Yes  Who is the provider or what is the name of  the office in which the patient attends annual eye exams? Dr. Geroge If pt is not established with a provider, would they like to be referred to a provider to establish care? No .   Dental Screening: Recommended annual dental exams for proper oral hygiene  Diabetic Foot Exam: Diabetic Foot Exam: Completed N/A   Community Resource Referral / Chronic Care Management: CRR required this visit?  No   CCM required this visit?  No     Plan:     I have personally reviewed and noted the following in the patient's chart:   Medical and social history Use of alcohol, tobacco or illicit drugs  Current medications and supplements including opioid prescriptions. Patient is not currently taking opioid prescriptions. Functional ability and status Nutritional status Physical activity Advanced directives List of other physicians Hospitalizations, surgeries, and ER visits  in previous 12 months Vitals Screenings to include cognitive, depression, and falls Referrals and appointments  In addition, I have reviewed and discussed with patient certain preventive protocols, quality metrics, and best practice recommendations. A written personalized care plan for preventive services as well as general preventive health recommendations were provided to patient.     Roxan JAYSON Plough, NP   03/13/2024   After Visit Summary: (In Person-Printed) AVS printed and given to the patient  Nurse Notes: Advised to get shingles vaccine at the pharmacy.

## 2024-03-18 ENCOUNTER — Ambulatory Visit
Admission: RE | Admit: 2024-03-18 | Discharge: 2024-03-18 | Disposition: A | Discharge status: Home / Self Care | Source: Ambulatory Visit | Attending: Family | Admitting: Family

## 2024-03-18 DIAGNOSIS — Z1231 Encounter for screening mammogram for malignant neoplasm of breast: Secondary | ICD-10-CM

## 2024-09-06 ENCOUNTER — Other Ambulatory Visit: Payer: Self-pay

## 2024-09-11 ENCOUNTER — Ambulatory Visit: Payer: Self-pay | Admitting: Family

## 2025-03-14 ENCOUNTER — Ambulatory Visit: Payer: Self-pay | Admitting: Family
